# Patient Record
Sex: Male | Born: 1995
Health system: Southern US, Community
[De-identification: ages and names within clinical notes are randomized; demographics above are authoritative.]

## PROBLEM LIST (undated history)

## (undated) DIAGNOSIS — Q9262 Marker chromosomes in abnormal individual: Secondary | ICD-10-CM

## (undated) DIAGNOSIS — K859 Acute pancreatitis without necrosis or infection, unspecified: Secondary | ICD-10-CM

## (undated) DIAGNOSIS — F840 Autistic disorder: Secondary | ICD-10-CM

## (undated) HISTORY — DX: Acute pancreatitis without necrosis or infection, unspecified: K85.90

## (undated) HISTORY — DX: Marker chromosomes in abnormal individual: Q92.62

## (undated) HISTORY — DX: Autistic disorder: F84.0

---
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
## 2014-12-17 ENCOUNTER — Encounter (HOSPITAL_COMMUNITY): Payer: Self-pay

## 2014-12-17 ENCOUNTER — Inpatient Hospital Stay (HOSPITAL_COMMUNITY): Payer: 59

## 2014-12-17 ENCOUNTER — Inpatient Hospital Stay (HOSPITAL_COMMUNITY)
Admission: EM | Admit: 2014-12-17 | Discharge: 2014-12-28 | DRG: 439 | Disposition: A | Discharge status: Home / Self Care | Payer: 59 | Attending: General Surgery | Admitting: General Surgery

## 2014-12-17 ENCOUNTER — Emergency Department (HOSPITAL_COMMUNITY): Payer: 59

## 2014-12-17 DIAGNOSIS — Z8249 Family history of ischemic heart disease and other diseases of the circulatory system: Secondary | ICD-10-CM

## 2014-12-17 DIAGNOSIS — K3580 Unspecified acute appendicitis: Secondary | ICD-10-CM

## 2014-12-17 DIAGNOSIS — R112 Nausea with vomiting, unspecified: Secondary | ICD-10-CM | POA: Diagnosis present

## 2014-12-17 DIAGNOSIS — E669 Obesity, unspecified: Secondary | ICD-10-CM | POA: Diagnosis present

## 2014-12-17 DIAGNOSIS — K859 Acute pancreatitis without necrosis or infection, unspecified: Secondary | ICD-10-CM | POA: Diagnosis present

## 2014-12-17 DIAGNOSIS — F840 Autistic disorder: Secondary | ICD-10-CM | POA: Diagnosis present

## 2014-12-17 DIAGNOSIS — Z6833 Body mass index (BMI) 33.0-33.9, adult: Secondary | ICD-10-CM

## 2014-12-17 DIAGNOSIS — R935 Abnormal findings on diagnostic imaging of other abdominal regions, including retroperitoneum: Secondary | ICD-10-CM | POA: Diagnosis not present

## 2014-12-17 DIAGNOSIS — Z825 Family history of asthma and other chronic lower respiratory diseases: Secondary | ICD-10-CM | POA: Diagnosis not present

## 2014-12-17 DIAGNOSIS — K219 Gastro-esophageal reflux disease without esophagitis: Secondary | ICD-10-CM | POA: Diagnosis not present

## 2014-12-17 DIAGNOSIS — K850 Idiopathic acute pancreatitis: Secondary | ICD-10-CM | POA: Diagnosis not present

## 2014-12-17 LAB — CBC WITH DIFFERENTIAL/PLATELET
Basophils Absolute: 0 10*3/uL (ref 0.0–0.1)
Basophils Relative: 0 % (ref 0–1)
Eosinophils Absolute: 0.2 10*3/uL (ref 0.0–0.7)
Eosinophils Relative: 1 % (ref 0–5)
HEMATOCRIT: 47.1 % (ref 39.0–52.0)
Hemoglobin: 15.2 g/dL (ref 13.0–17.0)
Lymphocytes Relative: 10 % — ABNORMAL LOW (ref 12–46)
Lymphs Abs: 1.6 10*3/uL (ref 0.7–4.0)
MCH: 26.4 pg (ref 26.0–34.0)
MCHC: 32.3 g/dL (ref 30.0–36.0)
MCV: 81.8 fL (ref 78.0–100.0)
Monocytes Absolute: 1.1 10*3/uL — ABNORMAL HIGH (ref 0.1–1.0)
Monocytes Relative: 7 % (ref 3–12)
Neutro Abs: 13.2 10*3/uL — ABNORMAL HIGH (ref 1.7–7.7)
Neutrophils Relative %: 82 % — ABNORMAL HIGH (ref 43–77)
Platelets: 234 10*3/uL (ref 150–400)
RBC: 5.76 MIL/uL (ref 4.22–5.81)
RDW: 12.7 % (ref 11.5–15.5)
WBC: 16.2 10*3/uL — ABNORMAL HIGH (ref 4.0–10.5)

## 2014-12-17 LAB — COMPREHENSIVE METABOLIC PANEL
ALT: 39 U/L (ref 17–63)
AST: 31 U/L (ref 15–41)
Albumin: 4.4 g/dL (ref 3.5–5.0)
Alkaline Phosphatase: 104 U/L (ref 38–126)
Anion gap: 13 (ref 5–15)
BUN: 12 mg/dL (ref 6–20)
CO2: 22 mmol/L (ref 22–32)
Calcium: 9.8 mg/dL (ref 8.9–10.3)
Chloride: 103 mmol/L (ref 101–111)
Creatinine, Ser: 0.93 mg/dL (ref 0.61–1.24)
GFR calc Af Amer: >60 mL/min (ref >60)
GFR calc non Af Amer: >60 mL/min (ref >60)
Glucose, Bld: 156 mg/dL — ABNORMAL HIGH (ref 65–99)
Potassium: 3.5 mmol/L (ref 3.5–5.1)
Sodium: 138 mmol/L (ref 135–145)
Total Bilirubin: 1.3 mg/dL — ABNORMAL HIGH (ref 0.3–1.2)
Total Protein: 7.5 g/dL (ref 6.5–8.1)

## 2014-12-17 LAB — URINALYSIS, ROUTINE W REFLEX MICROSCOPIC
Glucose, UA: NEGATIVE mg/dL
HGB URINE DIPSTICK: NEGATIVE
Ketones, ur: 40 mg/dL — AB
LEUKOCYTES UA: NEGATIVE
NITRITE: NEGATIVE
PH: 5.5 (ref 5.0–8.0)
PROTEIN: NEGATIVE mg/dL
Specific Gravity, Urine: 1.022 (ref 1.005–1.030)
UROBILINOGEN UA: 1 mg/dL (ref 0.0–1.0)

## 2014-12-17 LAB — LIPASE, BLOOD
LIPASE: 383 U/L — AB (ref 22–51)
Lipase: 278 U/L — ABNORMAL HIGH (ref 22–51)

## 2014-12-17 MED ORDER — POTASSIUM CL IN DEXTROSE 5% 20 MEQ/L IV SOLN: 20.0000 meq | INTRAVENOUS | Status: DC

## 2014-12-17 MED ORDER — ONDANSETRON HCL 4 MG/2ML IJ SOLN
4.0000 mg | Freq: Once | INTRAMUSCULAR | Status: AC
  Administered 2014-12-17: 4 mg via INTRAVENOUS
  Filled 2014-12-17: qty 2

## 2014-12-17 MED ORDER — SODIUM CHLORIDE 0.9 % IV BOLUS (SEPSIS): 750.0000 mL | Freq: Once | INTRAVENOUS | Status: DC

## 2014-12-17 MED ORDER — IOHEXOL 300 MG/ML  SOLN
100.0000 mL | Freq: Once | INTRAMUSCULAR | Status: AC | PRN
  Administered 2014-12-17: 100 mL via INTRAVENOUS

## 2014-12-17 MED ORDER — KCL IN DEXTROSE-NACL 20-5-0.9 MEQ/L-%-% IV SOLN
INTRAVENOUS | Status: DC
  Administered 2014-12-17: 17:00:00 via INTRAVENOUS
  Administered 2014-12-18: 150 mL/h via INTRAVENOUS
  Administered 2014-12-18 – 2014-12-19 (×3): via INTRAVENOUS
  Administered 2014-12-19: 1000 mL via INTRAVENOUS
  Administered 2014-12-19 – 2014-12-20 (×5): via INTRAVENOUS
  Administered 2014-12-21: 100 mL/h via INTRAVENOUS
  Administered 2014-12-21 – 2014-12-22 (×2): via INTRAVENOUS
  Administered 2014-12-22 (×2): 100 mL/h via INTRAVENOUS
  Administered 2014-12-23 – 2014-12-24 (×2): via INTRAVENOUS
  Administered 2014-12-25 – 2014-12-26 (×3): 100 mL/h via INTRAVENOUS
  Administered 2014-12-26: 05:00:00 via INTRAVENOUS
  Administered 2014-12-26: 100 mL/h via INTRAVENOUS
  Administered 2014-12-27: 50 mL/h via INTRAVENOUS
  Administered 2014-12-27: 10:00:00 via INTRAVENOUS
  Filled 2014-12-17 (×31): qty 1000

## 2014-12-17 MED ORDER — HYDROMORPHONE HCL 1 MG/ML IJ SOLN
0.5000 mg | INTRAMUSCULAR | Status: DC | PRN
  Administered 2014-12-17 – 2014-12-22 (×9): 1 mg via INTRAVENOUS
  Filled 2014-12-17 (×10): qty 1

## 2014-12-17 MED ORDER — DEXTROSE 5 % IV SOLN
1.0000 g | Freq: Once | INTRAVENOUS | Status: AC
  Administered 2014-12-17: 1 g via INTRAVENOUS
  Filled 2014-12-17: qty 1

## 2014-12-17 MED ORDER — ONDANSETRON 4 MG PO TBDP
4.0000 mg | ORAL_TABLET | Freq: Once | ORAL | Status: AC
  Administered 2014-12-17: 4 mg via ORAL
  Filled 2014-12-17: qty 1

## 2014-12-17 MED ORDER — HYDROMORPHONE HCL 1 MG/ML IJ SOLN
1.0000 mg | Freq: Once | INTRAMUSCULAR | Status: AC
  Administered 2014-12-17: 1 mg via INTRAVENOUS
  Filled 2014-12-17: qty 1

## 2014-12-17 MED ORDER — SODIUM CHLORIDE 0.9 % IV BOLUS (SEPSIS)
1000.0000 mL | Freq: Once | INTRAVENOUS | Status: AC
Start: 2014-12-17 — End: 2014-12-17
  Administered 2014-12-17: 1000 mL via INTRAVENOUS

## 2014-12-17 MED ORDER — HYDROMORPHONE HCL 1 MG/ML IJ SOLN
1.0000 mg | Freq: Once | INTRAMUSCULAR | Status: AC
Start: 2014-12-17 — End: 2014-12-17
  Administered 2014-12-17: 1 mg via INTRAVENOUS
  Filled 2014-12-17: qty 1

## 2014-12-17 MED ORDER — ONDANSETRON HCL 4 MG/2ML IJ SOLN
4.0000 mg | Freq: Four times a day (QID) | INTRAMUSCULAR | Status: DC | PRN
  Administered 2014-12-17 – 2014-12-21 (×3): 4 mg via INTRAVENOUS
  Filled 2014-12-17 (×4): qty 2

## 2014-12-17 MED ORDER — IOHEXOL 300 MG/ML  SOLN
50.0000 mL | Freq: Once | INTRAMUSCULAR | Status: AC | PRN
  Administered 2014-12-17: 50 mL via ORAL

## 2014-12-17 MED ORDER — ENOXAPARIN SODIUM 60 MG/0.6ML ~~LOC~~ SOLN
0.5000 mg/kg | SUBCUTANEOUS | Status: DC
  Administered 2014-12-17 – 2014-12-27 (×11): 50 mg via SUBCUTANEOUS
  Filled 2014-12-17 (×13): qty 0.6

## 2014-12-17 NOTE — ED Notes (Signed)
Urinal at the bedside, patient aware that a urine sample is needed.

## 2014-12-17 NOTE — H&P (Addendum)
Daryl Foster is an 19 y.o. male.   PCP: Dr. Lutricia Feil  Chief Complaint:  Right flank pain, nausea and vomiting  HPI: 19 y/o male with history of autism, here with both parents.  He awoke 4 AM with abdominal pain, and 2 episodes of vomiting.  He had a low grade temp 99.9. He has not eaten anything since last PM,  He lives at home with family and does not drink or smoke.  He is well cared for by his family. Family brought him to the ED this AM about 9 AM.  He is afebrile in the ED, HR up some, but VSS.  WBC up to 16.2, left shift, lipase is also elevated and 383, glucose is 156.  CT scan shows:  Inflammatory changes are noted around the pancreatic body and tail suggesting acute pancreatitis.  The appendix is mildly enlarged at approximately 9 mm with mild surrounding inflammation concerning for appendicitis in the appropriate clinical setting. There is no evidence of bowel obstruction. No abnormal fluid collection is noted. Urinary bladder appears normal. No significant adenopathy is noted.  We are ask to see   Past Medical History  Diagnosis Date  Autism   Body mass index is 33.21 kg/(m^2). 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 No pertinent past surgical history.  Family History  Problem Relation Age of Onset  . Hypertension Mother   . Asthma Father    Social History:  reports that he has never smoked. He has never used smokeless tobacco. He reports that he does not drink alcohol or use illicit drugs.  Allergies: No Known Allergies   Prior to Admission medications   Medication Sig Start Date End Date Taking? Authorizing Provider  Pediatric Multivit-Minerals-C (RA GUMMY VITAMINS & MINERALS PO) Take 1 tablet by mouth daily. Gummy vitamin not a tablet   Yes Historical Provider, MD     Results for orders placed or performed during the hospital encounter of 12/17/14 (from the past 48 hour(s))  CBC with Differential     Status: Abnormal   Collection  Time: 12/17/14  9:37 AM  Result Value Ref Range   WBC 16.2 (H) 4.0 - 10.5 K/uL   RBC 5.76 4.22 - 5.81 MIL/uL   Hemoglobin 15.2 13.0 - 17.0 g/dL   HCT 47.1 39.0 - 52.0 %   MCV 81.8 78.0 - 100.0 fL   MCH 26.4 26.0 - 34.0 pg   MCHC 32.3 30.0 - 36.0 g/dL   RDW 12.7 11.5 - 15.5 %   Platelets 234 150 - 400 K/uL   Neutrophils Relative % 82 (H) 43 - 77 %   Neutro Abs 13.2 (H) 1.7 - 7.7 K/uL   Lymphocytes Relative 10 (L) 12 - 46 %   Lymphs Abs 1.6 0.7 - 4.0 K/uL   Monocytes Relative 7 3 - 12 %   Monocytes Absolute 1.1 (H) 0.1 - 1.0 K/uL   Eosinophils Relative 1 0 - 5 %   Eosinophils Absolute 0.2 0.0 - 0.7 K/uL   Basophils Relative 0 0 - 1 %   Basophils Absolute 0.0 0.0 - 0.1 K/uL  Comprehensive metabolic panel     Status: Abnormal   Collection Time: 12/17/14  9:37 AM  Result Value Ref Range   Sodium 138 135 - 145 mmol/L   Potassium 3.5 3.5 - 5.1 mmol/L   Chloride 103 101 - 111 mmol/L   CO2 22 22 - 32 mmol/L   Glucose, Bld  156 (H) 65 - 99 mg/dL   BUN 12 6 - 20 mg/dL   Creatinine, Ser 7.65 0.61 - 1.24 mg/dL   Calcium 9.8 8.9 - 58.7 mg/dL   Total Protein 7.5 6.5 - 8.1 g/dL   Albumin 4.4 3.5 - 5.0 g/dL   AST 31 15 - 41 U/L   ALT 39 17 - 63 U/L   Alkaline Phosphatase 104 38 - 126 U/L   Total Bilirubin 1.3 (H) 0.3 - 1.2 mg/dL   GFR calc non Af Amer >60 >60 mL/min   GFR calc Af Amer >60 >60 mL/min    Comment: (NOTE) The eGFR has been calculated using the CKD EPI equation. This calculation has not been validated in all clinical situations. eGFR's persistently <60 mL/min signify possible Chronic Kidney Disease.    Anion gap 13 5 - 15  Lipase, blood     Status: Abnormal   Collection Time: 12/17/14  9:37 AM  Result Value Ref Range   Lipase 383 (H) 22 - 51 U/L  Urinalysis, Routine w reflex microscopic (not at [facility])     Status: Abnormal   Collection Time: 12/17/14 10:37 AM  Result Value Ref Range   Color, Urine AMBER (A) YELLOW    Comment: BIOCHEMICALS MAY BE AFFECTED BY  COLOR YELLOW    APPearance CLEAR CLEAR   Specific Gravity, Urine 1.022 1.005 - 1.030   pH 5.5 5.0 - 8.0   Glucose, UA NEGATIVE NEGATIVE mg/dL   Hgb urine dipstick NEGATIVE NEGATIVE   Bilirubin Urine SMALL (A) NEGATIVE   Ketones, ur 40 (A) NEGATIVE mg/dL   Protein, ur NEGATIVE NEGATIVE mg/dL   Urobilinogen, UA 1.0 0.0 - 1.0 mg/dL   Nitrite NEGATIVE NEGATIVE   Leukocytes, UA NEGATIVE NEGATIVE    Comment: MICROSCOPIC NOT DONE ON URINES WITH NEGATIVE PROTEIN, BLOOD, LEUKOCYTES, NITRITE, OR GLUCOSE <1000 mg/dL.   Ct Abdomen Pelvis W Contrast  12/17/2014   CLINICAL DATA:  Acute right flank pain.  EXAM: CT ABDOMEN AND PELVIS WITH CONTRAST  TECHNIQUE: Multidetector CT imaging of the abdomen and pelvis was performed using the standard protocol following bolus administration of intravenous contrast.  CONTRAST:  OMNIPAQUE IOHEXOL 300 MG/ML  SOLN  COMPARISON:  None.  FINDINGS: Visualized lung bases appear normal. No significant osseous abnormality is noted.  No gallstones are noted. The liver and spleen appear normal. Inflammatory changes are noted around the pancreatic body and tail suggesting acute pancreatitis. Adrenal glands and kidneys appear normal. No hydronephrosis or renal obstruction is noted. No renal or ureteral calculi are noted. The appendix is mildly enlarged at approximately 9 mm with mild surrounding inflammation concerning for appendicitis in the appropriate clinical setting. There is no evidence of bowel obstruction. No abnormal fluid collection is noted. Urinary bladder appears normal. No significant adenopathy is noted.  IMPRESSION: Inflammatory changes are noted around the pancreatic body and tail concerning for acute pancreatitis. No pseudocyst formation is noted. Correlation with laboratory data is recommended.  The appendix appears to be mildly enlarged with surrounding inflammatory changes suggesting acute appendicitis in the appropriate clinical setting.   Electronically Signed    By: Lupita Raider, M.D.   On: 12/17/2014 13:34    Review of Systems  Constitutional: Negative.   HENT: Negative.   Eyes: Negative.   Respiratory: Negative.   Cardiovascular: Negative.   Gastrointestinal: Negative.   Genitourinary: Negative.   Musculoskeletal: Negative.   Skin: Negative.   Neurological: Negative.   Endo/Heme/Allergies: Negative.   Psychiatric/Behavioral: Negative.  Blood pressure 126/60, pulse 109, temperature 98.9 F (37.2 C), temperature source Oral, resp. rate 16, height $RemoveBe'5\' 9"'SoowHnlAB$  (1.753 m), weight 102.059 kg (225 lb), SpO2 100 %. Physical Exam  Constitutional: He appears well-developed and well-nourished. No distress.  Feels better after dilaudid   HENT:  Head: Normocephalic and atraumatic.  Nose: Nose normal.  Eyes: Conjunctivae and EOM are normal. Right eye exhibits no discharge. Left eye exhibits no discharge. No scleral icterus.  Neck: Normal range of motion. Neck supple. No JVD present. No tracheal deviation present. No thyromegaly present.  Cardiovascular: Regular rhythm, normal heart sounds and intact distal pulses.   No murmur heard. tahcycardic  Respiratory: Effort normal and breath sounds normal. No respiratory distress. He has no wheezes. He has no rales. He exhibits no tenderness.  GI: Soft. Bowel sounds are normal. He exhibits no distension and no mass. There is tenderness (He is most tender LUQ and midepigastric area.  No tenderness RLQ.  ). There is no rebound and no guarding.  Musculoskeletal: He exhibits no edema.  Lymphadenopathy:    He has no cervical adenopathy.  Neurological: He is alert. No cranial nerve deficit.  Skin: Skin is warm and dry. No rash noted. He is not diaphoretic. No erythema. No pallor.  Psychiatric: He has a normal mood and affect. His behavior is normal. Judgment and thought content normal.  He is autistic, family is with him and I think he understands some of what is occuring.     Assessment/Plan Acute  pancreatitis Leukocytosis Possible abnormal appendix Autism BMI 33  Plan:  Admit, NPO, Hydrate, check abdominal ultrasound looking for gallstones/sludge, check lipid panel,follow labs and progression. It would be very unusual to have both pancreatitis and appendicitis concurrently.  We currently believe the pancreatitis is the major issue here.  JENNINGS,WILLARD 12/17/2014, 2:20 PM   I have interviewed and examined the patient and discussed with family members present, reviewed imaging personally and lab work. He clearly appears to have acute pancreatitis which I suspect is somewhat mild based on his lipase and CT scan. His pain is in the upper abdomen and he is mildly to moderately tender in the upper abdomen. No significant right lower quadrant tenderness. I do not believe he has appendicitis based on his clinical presentation. Source for pancreatitis is not clear. He does not drink alcohol,prescription medications and gallbladder ultrasound is negative. Triglycerides will be drawn in the morning. Continue IV fluids and bowel rest.  Edward Jolly MD, FACS  12/17/2014, 5:24 PM

## 2014-12-17 NOTE — Progress Notes (Signed)
PHARMACY CONSULT: Lovenox for VTE prophylaxis   Wt: 102 kg BMI:  33 Scr:  0.93 CrCl >30 ml/hr  H/H: 47.1/15.2 Pltc: 234  A/P:  Patient admitted with likely pancreatitis. Current plan by surgery is bowel rest. Due to morbid obesity, begin weight-adjusted lovenox 0.5mg /kg SQ q24h  Monitor CBC and renal function, adjust as needed   Clance Boll, PharmD, BCPS Pager: [phone] 12/17/2014 3:32 PM

## 2014-12-17 NOTE — ED Notes (Signed)
Surgery PA at bedside.  

## 2014-12-17 NOTE — ED Notes (Signed)
5TH FLOOR CALLED AT 15:40.Marland KitchenMarland KitchenKLJ

## 2014-12-17 NOTE — ED Notes (Signed)
Patient is autistic. Patient c/o c/o right flank pain and vomiting x 2 today since 0430.

## 2014-12-17 NOTE — ED Provider Notes (Signed)
CSN: [id number]     Arrival date & time 12/17/14  4098 History   First MD Initiated Contact with Patient 12/17/14 [phone]     Chief Complaint  Patient presents with  . Flank Pain     (Consider location/radiation/quality/duration/timing/severity/associated sxs/prior Treatment) Patient is a 19 y.o. male presenting with flank pain. The history is provided by the patient and a parent. No language interpreter was used.  Flank Pain Associated symptoms include abdominal pain, nausea and vomiting. Pertinent negatives include no chest pain or fever.  Daryl Foster is a 19 year old male with a history of autism who presents for abdominal pain that awoke him from sleep at 4 AM today as well as 2 episodes of vomiting without blood. Mom states she gave him tums. She took his temperature this morning which was 99.9. She denies anything out of the ordinary such as him not eating appropriately or change in diet. Nothing makes his symptoms better or worse. Daryl Foster denies any chills, chest pain, shortness of breath, cough, diarrhea, constipation, dysuria, hematuria, urinary frequency, hematochezia.  Mom denies any prior abdominal surgeries. Last BM was last night. Father has history of appendectomy.    Past Medical History  Diagnosis Date  . Autism   . 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 No pertinent past surgical history. Family History  Problem Relation Age of Onset  . Hypertension Mother   . Asthma Father    History  Substance Use Topics  . Smoking status: Never Smoker   . Smokeless tobacco: Never Used  . Alcohol Use: No    Review of Systems  Constitutional: Negative for fever.  Cardiovascular: Negative for chest pain.  Gastrointestinal: Positive for nausea, vomiting and abdominal pain.  Genitourinary: Negative for flank pain.  All other systems reviewed and are negative.     Allergies  Review of patient's allergies indicates no known allergies.  Home Medications    Prior to Admission medications   Medication Sig Start Date End Date Taking? Authorizing Provider  Pediatric Multivit-Minerals-C (RA GUMMY VITAMINS & MINERALS PO) Take 1 tablet by mouth daily. Gummy vitamin not a tablet   Yes Historical Provider, MD   BP 126/60 mmHg  Pulse 109  Temp(Src) 99.4 F (37.4 C) (Oral)  Resp 16  Ht  (1.753 m)  Wt 225 lb (102.059 kg)  BMI 33.21 kg/m2  SpO2 100% Physical Exam  Constitutional: Daryl Foster is oriented to person, place, and time. Daryl Foster appears well-developed and well-nourished.  Verbal and cooperative.   HENT:  Head: Normocephalic and atraumatic.  Eyes: Conjunctivae are normal.  Neck: Normal range of motion. Neck supple.  Cardiovascular: Normal rate, regular rhythm and normal heart sounds.   Pulmonary/Chest: Effort normal and breath sounds normal.  Abdominal: Soft. Daryl Foster exhibits no distension and no mass. There is generalized tenderness. There is no rigidity, no rebound, no guarding and no CVA tenderness.  Obese abdomen. Daryl Foster has generalized abdominal tenderness without rebound or guarding. No CVA tenderness. Actively vomiting at bedside.  Musculoskeletal: Normal range of motion.  Neurological: Daryl Foster is alert and oriented to person, place, and time.  Skin: Skin is warm and dry.  Psychiatric: Daryl Foster has a normal mood and affect. His behavior is normal.  Nursing note and vitals reviewed.   ED Course  Procedures (including critical care time) Labs Review Labs Reviewed  CBC WITH DIFFERENTIAL/PLATELET - Abnormal; Notable for the following:    WBC 16.2 (*)    Neutrophils Relative % 82 (*)  Neutro Abs 13.2 (*)    Lymphocytes Relative 10 (*)    Monocytes Absolute 1.1 (*)    All other components within normal limits  COMPREHENSIVE METABOLIC PANEL - Abnormal; Notable for the following:    Glucose, Bld 156 (*)    Total Bilirubin 1.3 (*)    All other components within normal limits  LIPASE, BLOOD - Abnormal; Notable for the following:    Lipase 383 (*)     All other components within normal limits  URINALYSIS, ROUTINE W REFLEX MICROSCOPIC (NOT AT [facility]) - Abnormal; Notable for the following:    Color, Urine AMBER (*)    Bilirubin Urine SMALL (*)    Ketones, ur 40 (*)    All other components within normal limits  LIPASE, BLOOD    Imaging Review Ct Abdomen Pelvis W Contrast  12/17/2014   CLINICAL DATA:  Acute right flank pain.  EXAM: CT ABDOMEN AND PELVIS WITH CONTRAST  TECHNIQUE: Multidetector CT imaging of the abdomen and pelvis was performed using the standard protocol following bolus administration of intravenous contrast.  CONTRAST:  OMNIPAQUE IOHEXOL 300 MG/ML  SOLN  COMPARISON:  None.  FINDINGS: Visualized lung bases appear normal. No significant osseous abnormality is noted.  No gallstones are noted. The liver and spleen appear normal. Inflammatory changes are noted around the pancreatic body and tail suggesting acute pancreatitis. Adrenal glands and kidneys appear normal. No hydronephrosis or renal obstruction is noted. No renal or ureteral calculi are noted. The appendix is mildly enlarged at approximately 9 mm with mild surrounding inflammation concerning for appendicitis in the appropriate clinical setting. There is no evidence of bowel obstruction. No abnormal fluid collection is noted. Urinary bladder appears normal. No significant adenopathy is noted.  IMPRESSION: Inflammatory changes are noted around the pancreatic body and tail concerning for acute pancreatitis. No pseudocyst formation is noted. Correlation with laboratory data is recommended.  The appendix appears to be mildly enlarged with surrounding inflammatory changes suggesting acute appendicitis in the appropriate clinical setting.   Electronically Signed   By: Lupita Raider, M.D.   On: 12/17/2014 13:34     EKG Interpretation None      MDM   Final diagnoses:  Acute appendicitis, unspecified acute appendicitis type  Acute pancreatitis, unspecified pancreatitis type    Patient presents for abdominal pain that awoke him at 7am with nausea and vomiting. Daryl Foster is stable but tachycardic. Daryl Foster is well appearing and in no acute distress after pain and nausea medications. Medications  cefOXitin (MEFOXIN) 1 g in dextrose 5 % 50 mL IVPB (1 g Intravenous New Bag/Given 12/17/14 1508)  sodium chloride 0.9 % bolus 750 mL (not administered)  HYDROmorphone (DILAUDID) injection 0.5-1 mg (not administered)  ondansetron (ZOFRAN) injection 4 mg (not administered)  dextrose 5 % and 0.9 % NaCl with KCl 20 mEq/L infusion (not administered)  ondansetron (ZOFRAN-ODT) disintegrating tablet 4 mg (4 mg Oral Given 12/17/14 0926)  sodium chloride 0.9 % bolus 1,000 mL (0 mLs Intravenous Stopped 12/17/14 1121)  HYDROmorphone (DILAUDID) injection 1 mg (1 mg Intravenous Given 12/17/14 1028)  ondansetron (ZOFRAN) injection 4 mg (4 mg Intravenous Given 12/17/14 1038)  iohexol (OMNIPAQUE) 300 MG/ML solution 50 mL (50 mLs Oral Contrast Given 12/17/14 1115)  iohexol (OMNIPAQUE) 300 MG/ML solution 100 mL (100 mLs Intravenous Contrast Given 12/17/14 1304)  ondansetron (ZOFRAN) injection 4 mg (4 mg Intravenous Given 12/17/14 1429)  sodium chloride 0.9 % bolus 1,000 mL (1,000 mLs Intravenous New Bag/Given 12/17/14 1429)  HYDROmorphone (DILAUDID)  injection 1 mg (1 mg Intravenous Given 12/17/14 1446)  Daryl Foster has an elevated WBC of 16.2, lipase of 383, and bili of 1.3.  CT abdomen shows acute pancreatitis without pseudocyst formation and appendicitis. Patient was kept NPO and I have ordered cefoxitin. I informed Dr. Estell Harpin regarding the findings. I spoke to Will the PA for general surgery who will admit the patient.     Catha Gosselin, PA-C 12/17/14 1533  Bethann Berkshire, MD 12/17/14 [phone]

## 2014-12-18 LAB — CBC
HCT: 43.2 % (ref 39.0–52.0)
HEMOGLOBIN: 13.5 g/dL (ref 13.0–17.0)
MCH: 25.7 pg — AB (ref 26.0–34.0)
MCHC: 31.3 g/dL (ref 30.0–36.0)
MCV: 82.1 fL (ref 78.0–100.0)
PLATELETS: 184 10*3/uL (ref 150–400)
RBC: 5.26 MIL/uL (ref 4.22–5.81)
RDW: 12.6 % (ref 11.5–15.5)
WBC: 12.1 10*3/uL — ABNORMAL HIGH (ref 4.0–10.5)

## 2014-12-18 LAB — COMPREHENSIVE METABOLIC PANEL
ALT: 27 U/L (ref 17–63)
ANION GAP: 6 (ref 5–15)
AST: 17 U/L (ref 15–41)
Albumin: 3.7 g/dL (ref 3.5–5.0)
Alkaline Phosphatase: 83 U/L (ref 38–126)
BUN: 7 mg/dL (ref 6–20)
CALCIUM: 8.8 mg/dL — AB (ref 8.9–10.3)
CO2: 27 mmol/L (ref 22–32)
CREATININE: 0.87 mg/dL (ref 0.61–1.24)
Chloride: 106 mmol/L (ref 101–111)
GFR calc non Af Amer: >60 mL/min (ref >60)
GLUCOSE: 128 mg/dL — AB (ref 65–99)
Potassium: 3.9 mmol/L (ref 3.5–5.1)
SODIUM: 139 mmol/L (ref 135–145)
Total Bilirubin: 1.2 mg/dL (ref 0.3–1.2)
Total Protein: 6.7 g/dL (ref 6.5–8.1)

## 2014-12-18 LAB — LIPID PANEL
Cholesterol: 105 mg/dL (ref 0–200)
HDL: 20 mg/dL — AB (ref >40)
LDL CALC: 67 mg/dL (ref 0–99)
TRIGLYCERIDES: 89 mg/dL (ref <150)
Total CHOL/HDL Ratio: 5.3 RATIO
VLDL: 18 mg/dL (ref 0–40)

## 2014-12-18 LAB — LIPASE, BLOOD: Lipase: 121 U/L — ABNORMAL HIGH (ref 22–51)

## 2014-12-18 MED ORDER — ACETAMINOPHEN 325 MG PO TABS
650.0000 mg | ORAL_TABLET | Freq: Four times a day (QID) | ORAL | Status: DC | PRN
  Administered 2014-12-18 – 2014-12-26 (×12): 650 mg via ORAL
  Filled 2014-12-18 (×12): qty 2

## 2014-12-18 NOTE — Progress Notes (Signed)
Subjective: Less pain today.  Objective: Vital signs in last 24 hours: Temp:  [98.5 F (36.9 C)-101.1 F (38.4 C)] 101.1 F (38.4 C) (07/23 0535) Pulse Rate:  [107-118] 118 (07/23 0535) Resp:  [16-18] 18 (07/23 0535) BP: (119-142)/(60-81) 119/74 mmHg (07/23 0535) SpO2:  [94 %-100 %] 94 % (07/23 0535) Last BM Date: 12/17/14  Intake/Output from previous day: 07/22 0701 - 07/23 0700 In: 1635.4 [I.V.:1635.4] Out: -  Intake/Output this shift:    PE: General- In NAD Abdomen-mild LLQ tenderness, moderate epigastric tenderness and guarding, no RLQ tenderness.  Lab Results:   Recent Labs  12/17/14 0937 12/18/14 0430  WBC 16.2* 12.1*  HGB 15.2 13.5  HCT 47.1 43.2  PLT 234 184   BMET  Recent Labs  12/17/14 0937 12/18/14 0430  NA 138 139  K 3.5 3.9  CL 103 106  CO2 22 27  GLUCOSE 156* 128*  BUN 12 7  CREATININE 0.93 0.87  CALCIUM 9.8 8.8*   PT/INR No results for input(s): LABPROT, INR in the last 72 hours. Comprehensive Metabolic Panel:    Component Value Date/Time   NA 139 12/18/2014 0430   NA 138 12/17/2014 0937   K 3.9 12/18/2014 0430   K 3.5 12/17/2014 0937   CL 106 12/18/2014 0430   CL 103 12/17/2014 0937   CO2 27 12/18/2014 0430   CO2 22 12/17/2014 0937   BUN 7 12/18/2014 0430   BUN 12 12/17/2014 0937   CREATININE 0.87 12/18/2014 0430   CREATININE 0.93 12/17/2014 0937   GLUCOSE 128* 12/18/2014 0430   GLUCOSE 156* 12/17/2014 0937   CALCIUM 8.8* 12/18/2014 0430   CALCIUM 9.8 12/17/2014 0937   AST 17 12/18/2014 0430   AST 31 12/17/2014 0937   ALT 27 12/18/2014 0430   ALT 39 12/17/2014 0937   ALKPHOS 83 12/18/2014 0430   ALKPHOS 104 12/17/2014 0937   BILITOT 1.2 12/18/2014 0430   BILITOT 1.3* 12/17/2014 0937   PROT 6.7 12/18/2014 0430   PROT 7.5 12/17/2014 0937   ALBUMIN 3.7 12/18/2014 0430   ALBUMIN 4.4 12/17/2014 0937     Studies/Results: US Abdomen Complete  12/17/2014   CLINICAL DATA:  Pancreatitis  EXAM: ULTRASOUND ABDOMEN  COMPLETE  COMPARISON:  12/16/2004 CT abdomen/ pelvis  FINDINGS: Gallbladder: No gallstones or wall thickening visualized. No sonographic Murphy sign noted.  Common bile duct: Diameter: 3 mm  Liver: Hepatic increased echogenicity likely indicates underlying steatosis without focal abnormality or intrahepatic ductal dilatation.  IVC: No abnormality visualized.  Pancreas: Obscured by bowel gas  Spleen: Size and appearance within normal limits.  Right Kidney: Length: 11.4 cm. Echogenicity within normal limits. No mass or hydronephrosis visualized.  Left Kidney: Length: 11 cm. Echogenicity within normal limits. No mass or hydronephrosis visualized.  Abdominal aorta: No proximal aortic aneurysm. Mid and distal aorta obscured by bowel gas.  Other findings: None.  IMPRESSION: Hepatic increased echogenicity likely indicates underlying steatosis without focal abnormality or intrahepatic ductal dilatation. No acute intra-abdominal or pelvic pathology.   Electronically Signed   By: Christiana Pellant M.D.   On: 12/17/2014 16:16   Ct Abdomen Pelvis W Contrast  12/17/2014   CLINICAL DATA:  Acute right flank pain.  EXAM: CT ABDOMEN AND PELVIS WITH CONTRAST  TECHNIQUE: Multidetector CT imaging of the abdomen and pelvis was performed using the standard protocol following bolus administration of intravenous contrast.  CONTRAST:  OMNIPAQUE IOHEXOL 300 MG/ML  SOLN  COMPARISON:  None.  FINDINGS: Visualized lung bases appear normal. No  significant osseous abnormality is noted.  No gallstones are noted. The liver and spleen appear normal. Inflammatory changes are noted around the pancreatic body and tail suggesting acute pancreatitis. Adrenal glands and kidneys appear normal. No hydronephrosis or renal obstruction is noted. No renal or ureteral calculi are noted. The appendix is mildly enlarged at approximately 9 mm with mild surrounding inflammation concerning for appendicitis in the appropriate clinical setting. There is no  evidence of bowel obstruction. No abnormal fluid collection is noted. Urinary bladder appears normal. No significant adenopathy is noted.  IMPRESSION: Inflammatory changes are noted around the pancreatic body and tail concerning for acute pancreatitis. No pseudocyst formation is noted. Correlation with laboratory data is recommended.  The appendix appears to be mildly enlarged with surrounding inflammatory changes suggesting acute appendicitis in the appropriate clinical setting.   Electronically Signed   By: Lupita Raider, M.D.   On: 12/17/2014 13:34    Anti-infectives: Anti-infectives    Start     Dose/Rate Route Frequency Ordered Stop   12/17/14 1415  cefOXitin (MEFOXIN) 1 g in dextrose 5 % 50 mL IVPB     1 g 100 mL/hr over 30 Minutes Intravenous  Once 12/17/14 1405 12/17/14 1538      Assessment Active Problems:   Acute pancreatitis-etiology is unclear; no gallstones, no significantly elevated lipids:  Clinically improving; no clinical evidence of acute appendicitis;     LOS: 1 day   Plan: Continue bowel rest and aggressive hydration.  Repeat lab tomorrow.   Daryl Foster Shela Commons 12/18/2014

## 2014-12-19 LAB — CBC
HEMATOCRIT: 39.9 % (ref 39.0–52.0)
HEMOGLOBIN: 12.4 g/dL — AB (ref 13.0–17.0)
MCH: 25.8 pg — ABNORMAL LOW (ref 26.0–34.0)
MCHC: 31.1 g/dL (ref 30.0–36.0)
MCV: 83.1 fL (ref 78.0–100.0)
Platelets: 146 10*3/uL — ABNORMAL LOW (ref 150–400)
RBC: 4.8 MIL/uL (ref 4.22–5.81)
RDW: 12.6 % (ref 11.5–15.5)
WBC: 12.6 10*3/uL — AB (ref 4.0–10.5)

## 2014-12-19 LAB — COMPREHENSIVE METABOLIC PANEL
ALK PHOS: 69 U/L (ref 38–126)
ALT: 21 U/L (ref 17–63)
AST: 14 U/L — ABNORMAL LOW (ref 15–41)
Albumin: 3.5 g/dL (ref 3.5–5.0)
Anion gap: 5 (ref 5–15)
BUN: 6 mg/dL (ref 6–20)
CHLORIDE: 107 mmol/L (ref 101–111)
CO2: 24 mmol/L (ref 22–32)
Calcium: 8.6 mg/dL — ABNORMAL LOW (ref 8.9–10.3)
Creatinine, Ser: 0.85 mg/dL (ref 0.61–1.24)
GFR calc non Af Amer: >60 mL/min (ref >60)
GLUCOSE: 122 mg/dL — AB (ref 65–99)
Potassium: 3.9 mmol/L (ref 3.5–5.1)
SODIUM: 136 mmol/L (ref 135–145)
TOTAL PROTEIN: 6.5 g/dL (ref 6.5–8.1)
Total Bilirubin: 1.5 mg/dL — ABNORMAL HIGH (ref 0.3–1.2)

## 2014-12-19 LAB — LIPASE, BLOOD: Lipase: 37 U/L (ref 22–51)

## 2014-12-19 NOTE — Progress Notes (Signed)
Subjective: History per mom. Still spiking intermittent temp up to 101 - verified with nursing staff (no fever recorded in EMR but nurse does confirm temp). Pt states he feels better; no n/v.  Objective: Vital signs in last 24 hours: Temp:  [98.5 F (36.9 C)-100.1 F (37.8 C)] 100.1 F (37.8 C) (07/24 0745) Pulse Rate:  [102-113] 105 (07/24 0600) Resp:  [18] 18 (07/24 0600) BP: (115-144)/(63-79) 134/78 mmHg (07/24 0600) SpO2:  [94 %-96 %] 96 % (07/24 0600) Last BM Date: 12/17/14  Intake/Output from previous day: 07/23 0701 - 07/24 0700 In: 3160.8 [I.V.:3160.8] Out: -  Intake/Output this shift:    Alert, nad, laying on beside couch, not ill appearing cta  Soft, obese no RLQ/periumbilical TTP; some grimacing with epigastric palpation  Lab Results:   Recent Labs  12/18/14 0430 12/19/14 0610  WBC 12.1* 12.6*  HGB 13.5 12.4*  HCT 43.2 39.9  PLT 184 146*   BMET  Recent Labs  12/18/14 0430 12/19/14 0610  NA 139 136  K 3.9 3.9  CL 106 107  CO2 27 24  GLUCOSE 128* 122*  BUN 7 6  CREATININE 0.87 0.85  CALCIUM 8.8* 8.6*   PT/INR No results for input(s): LABPROT, INR in the last 72 hours. ABG No results for input(s): PHART, HCO3 in the last 72 hours.  Invalid input(s): PCO2, PO2  Studies/Results: US Abdomen Complete  12/17/2014   CLINICAL DATA:  Pancreatitis  EXAM: ULTRASOUND ABDOMEN COMPLETE  COMPARISON:  12/16/2004 CT abdomen/ pelvis  FINDINGS: Gallbladder: No gallstones or wall thickening visualized. No sonographic Murphy sign noted.  Common bile duct: Diameter: 3 mm  Liver: Hepatic increased echogenicity likely indicates underlying steatosis without focal abnormality or intrahepatic ductal dilatation.  IVC: No abnormality visualized.  Pancreas: Obscured by bowel gas  Spleen: Size and appearance within normal limits.  Right Kidney: Length: 11.4 cm. Echogenicity within normal limits. No mass or hydronephrosis visualized.  Left Kidney: Length: 11 cm. Echogenicity  within normal limits. No mass or hydronephrosis visualized.  Abdominal aorta: No proximal aortic aneurysm. Mid and distal aorta obscured by bowel gas.  Other findings: None.  IMPRESSION: Hepatic increased echogenicity likely indicates underlying steatosis without focal abnormality or intrahepatic ductal dilatation. No acute intra-abdominal or pelvic pathology.   Electronically Signed   By: Christiana Pellant M.D.   On: 12/17/2014 16:16   Ct Abdomen Pelvis W Contrast  12/17/2014   CLINICAL DATA:  Acute right flank pain.  EXAM: CT ABDOMEN AND PELVIS WITH CONTRAST  TECHNIQUE: Multidetector CT imaging of the abdomen and pelvis was performed using the standard protocol following bolus administration of intravenous contrast.  CONTRAST:  OMNIPAQUE IOHEXOL 300 MG/ML  SOLN  COMPARISON:  None.  FINDINGS: Visualized lung bases appear normal. No significant osseous abnormality is noted.  No gallstones are noted. The liver and spleen appear normal. Inflammatory changes are noted around the pancreatic body and tail suggesting acute pancreatitis. Adrenal glands and kidneys appear normal. No hydronephrosis or renal obstruction is noted. No renal or ureteral calculi are noted. The appendix is mildly enlarged at approximately 9 mm with mild surrounding inflammation concerning for appendicitis in the appropriate clinical setting. There is no evidence of bowel obstruction. No abnormal fluid collection is noted. Urinary bladder appears normal. No significant adenopathy is noted.  IMPRESSION: Inflammatory changes are noted around the pancreatic body and tail concerning for acute pancreatitis. No pseudocyst formation is noted. Correlation with laboratory data is recommended.  The appendix appears to be mildly enlarged with  surrounding inflammatory changes suggesting acute appendicitis in the appropriate clinical setting.   Electronically Signed   By: Lupita Raider, M.D.   On: 12/17/2014 13:34     Anti-infectives: Anti-infectives    Start     Dose/Rate Route Frequency Ordered Stop   12/17/14 1415  cefOXitin (MEFOXIN) 1 g in dextrose 5 % 50 mL IVPB     1 g 100 mL/hr over 30 Minutes Intravenous  Once 12/17/14 1405 12/17/14 1538      Assessment/Plan: Acute pancreatitis  Lipase normal, wbc mildly elevated, still spiking some intermittent temp Clinically looks nontoxic Had prolong discussion with father on phone about pancreatitis - length/severity varies, hard to predict, etc.  Believe pancreatitis is still smoldering given temp although lipase normal. Etiology of pancreatitis unclear. Do not believe pt has appendicitis Will do a trial of clears today. Told mom and dad that if pt c/o of stomach pain, n/v, temp spikes we would have to go back to NPO and po trial may fail.  Would still hold off on abx at this point If pancreatitis cont to smolder may need to reimage in a few days  Father very concerned about when his diet is advanced to solids about types of food - pt has a VERY limited diet at home. Will only eat certain things and are things that are not healthy. Advised him we will cross that bridge when we get there.   Explained that Dr Donell Beers would be surgeon of the week   Mary Sella. Andrey Campanile, MD, FACS General, Bariatric, & Minimally Invasive Surgery [facility], [place]     LOS: 2 days    Atilano Ina 12/19/2014

## 2014-12-20 LAB — COMPREHENSIVE METABOLIC PANEL
ALK PHOS: 66 U/L (ref 38–126)
ALT: 21 U/L (ref 17–63)
ANION GAP: 7 (ref 5–15)
AST: 17 U/L (ref 15–41)
Albumin: 3.4 g/dL — ABNORMAL LOW (ref 3.5–5.0)
BUN: 5 mg/dL — ABNORMAL LOW (ref 6–20)
CALCIUM: 8.9 mg/dL (ref 8.9–10.3)
CO2: 25 mmol/L (ref 22–32)
CREATININE: 0.83 mg/dL (ref 0.61–1.24)
Chloride: 105 mmol/L (ref 101–111)
GFR calc Af Amer: >60 mL/min (ref >60)
Glucose, Bld: 127 mg/dL — ABNORMAL HIGH (ref 65–99)
Potassium: 3.7 mmol/L (ref 3.5–5.1)
SODIUM: 137 mmol/L (ref 135–145)
Total Bilirubin: 1.5 mg/dL — ABNORMAL HIGH (ref 0.3–1.2)
Total Protein: 7 g/dL (ref 6.5–8.1)

## 2014-12-20 LAB — CBC WITH DIFFERENTIAL/PLATELET
BASOS ABS: 0 10*3/uL (ref 0.0–0.1)
Basophils Relative: 0 % (ref 0–1)
EOS PCT: 2 % (ref 0–5)
Eosinophils Absolute: 0.2 10*3/uL (ref 0.0–0.7)
HCT: 40.6 % (ref 39.0–52.0)
Hemoglobin: 13 g/dL (ref 13.0–17.0)
Lymphocytes Relative: 15 % (ref 12–46)
Lymphs Abs: 1.7 10*3/uL (ref 0.7–4.0)
MCH: 26.3 pg (ref 26.0–34.0)
MCHC: 32 g/dL (ref 30.0–36.0)
MCV: 82 fL (ref 78.0–100.0)
MONO ABS: 1.4 10*3/uL — AB (ref 0.1–1.0)
Monocytes Relative: 12 % (ref 3–12)
NEUTROS ABS: 8.4 10*3/uL — AB (ref 1.7–7.7)
Neutrophils Relative %: 71 % (ref 43–77)
PLATELETS: 185 10*3/uL (ref 150–400)
RBC: 4.95 MIL/uL (ref 4.22–5.81)
RDW: 12.3 % (ref 11.5–15.5)
WBC: 11.7 10*3/uL — ABNORMAL HIGH (ref 4.0–10.5)

## 2014-12-20 LAB — LIPASE, BLOOD: LIPASE: 38 U/L (ref 22–51)

## 2014-12-20 NOTE — Progress Notes (Signed)
  Subjective: He seems better.  I had him examine himself and he was pain free in all 4 quadrants.  Mid upper epigastric site he was tender by his report.  His father thinks it's his Autism.  He tolerated clears and they would like to try solids.  I told them we were considering CT again and father was not very happy with that idea.  No nausea or pain yesterday even with clears.    Objective: Vital signs in last 24 hours: Temp:  [98.4 F (36.9 C)-100.5 F (38.1 C)] 99.7 F (37.6 C) (07/25 0549) Pulse Rate:  [106-125] 106 (07/25 0549) Resp:  [18] 18 (07/25 0549) BP: (113-137)/(74-85) 137/85 mmHg (07/25 0549) SpO2:  [97 %-100 %] 100 % (07/25 0549) Last BM Date: 12/16/14 477 PO  Diet: clears Voided x 9, no volume Temp 100.1 0700 yesterday and 100.5 at 1500 yesterday Labs this AM T bilirubin 1.5, other LFT'S NORMAL, WBC 11.7 [place] on 7/22 shows no gallstone or GB wall thickening, hepatic steatosis CT on 7/22 showed:  Inflammatory changes are noted around the pancreatic body and tail concerning for acute pancreatitis. No pseudocyst formation is noted. The appendix appears to be mildly enlarged with surrounding inflammatory changes suggesting acute appendicitis in the appropriate clinical setting. Temp 99.3 now Intake/Output from previous day: 07/24 0701 - 07/25 0700 In: 4077 [P.O.:477; I.V.:3600] Out: -  Intake/Output this shift:    General appearance: alert, cooperative and no distress Resp: clear to auscultation bilaterally and he enjoys the IS GI: soft, he reports some discomfort with the mid epigastric area on palpation.  Otherwise normal exam  Lab Results:   Recent Labs  12/19/14 0610 12/20/14 0556  WBC 12.6* 11.7*  HGB 12.4* 13.0  HCT 39.9 40.6  PLT 146* 185    BMET  Recent Labs  12/19/14 0610 12/20/14 0556  NA 136 137  K 3.9 3.7  CL 107 105  CO2 24 25  GLUCOSE 122* 127*  BUN 6 5*  CREATININE 0.85 0.83  CALCIUM 8.6* 8.9   PT/INR No results for input(s):  LABPROT, INR in the last 72 hours.   Recent Labs Lab 12/17/14 0937 12/18/14 0430 12/19/14 0610 12/20/14 0556  AST 31 17 14* 17  ALT 39 ALKPHOS 104 83 69 66  BILITOT 1.3* 1.2 1.5* 1.5*  PROT 7.5 6.7 6.5 7.0  ALBUMIN 4.4 3.7 3.5 3.4*     Lipase     Component Value Date/Time   LIPASE 38 12/20/2014 0556     Studies/Results: No results found.  Medications: . enoxaparin (LOVENOX) injection  0.5 mg/kg Subcutaneous Q24H  . sodium chloride  750 mL Intravenous Once   . dextrose 5 % and 0.9 % NaCl with KCl 20 mEq/L 150 mL/hr at 12/20/14 0754    Assessment/Plan Acute pancreatitis Leukocytosis Possible abnormal appendix Autism BMI 33 Antibiotics:  None DVT:  Lovenox/SCD  Plan:  I will leave him on clears for now, decrease his IV rate and discuss with Dr. Donell Beers. Vomited about 300 ml after initial visit.        LOS: 3 days    Joselin Crandell 12/20/2014

## 2014-12-20 NOTE — Care Management Note (Signed)
Case Management Note  Patient Details  Name: ZIMIR KITTLESON MRN: [id number] Date of Birth: [birth year]  Subjective/Objective:    19 y/o autisitic male, lives w/mother, from home. Admitted w/Pancreatitis.                Action/Plan:d/c plan home. No anticipated d/c needs.   Expected Discharge Date:   (unknown)               Expected Discharge Plan:  Home/Self Care  In-House Referral:     Discharge planning Services  CM Consult  Post Acute Care Choice:    Choice offered to:     DME Arranged:    DME Agency:     HH Arranged:    HH Agency:     Status of Service:  In process, will continue to follow  Medicare Important Message Given:    Date Medicare IM Given:    Medicare IM give by:    Date Additional Medicare IM Given:    Additional Medicare Important Message give by:     If discussed at Long Length of Stay Meetings, dates discussed:    Additional Comments:  Lanier Clam, RN 12/20/2014, 9:07 PM

## 2014-12-21 ENCOUNTER — Inpatient Hospital Stay (HOSPITAL_COMMUNITY): Payer: 59

## 2014-12-21 ENCOUNTER — Encounter (HOSPITAL_COMMUNITY): Payer: Self-pay | Admitting: Radiology

## 2014-12-21 MED ORDER — IOHEXOL 300 MG/ML  SOLN
25.0000 mL | INTRAMUSCULAR | Status: AC
  Administered 2014-12-21 (×2): 25 mL via ORAL

## 2014-12-21 MED ORDER — IOHEXOL 300 MG/ML  SOLN
100.0000 mL | Freq: Once | INTRAMUSCULAR | Status: AC | PRN
  Administered 2014-12-21: 100 mL via INTRAVENOUS

## 2014-12-21 NOTE — Progress Notes (Addendum)
Patient complains of nausea and vomiting, in addition to complaints of pain in the center of abdomen below the breast bone. Since last night had emesis 3 times with yellow/greenish color per patient mother report. Will continue to monitor.

## 2014-12-21 NOTE — Progress Notes (Signed)
  Subjective: Vomited again this AM, and last PM also.  Pain still mid upper epigastric area.    Objective: Vital signs in last 24 hours: Temp:  [98.6 F (37 C)-100.2 F (37.9 C)] 99.2 F (37.3 C) (07/26 0547) Pulse Rate:  [82-107] 82 (07/26 0547) Resp:  [18] 18 (07/26 0547) BP: (112-137)/(64-88) 137/88 mmHg (07/26 0547) SpO2:  [98 %-99 %] 99 % (07/26 0547) Last BM Date: 12/20/14 850 emesis yesterday, 760 PO Still having occasional low grade temp 100.2 Intake/Output from previous day: 07/25 0701 - 07/26 0700 In: 2410 [P.O.:760; I.V.:1650] Out: 850 [Emesis/NG output:850] Intake/Output this shift:    General appearance: alert, cooperative and no distress GI: soft complains of pain on palpation mid epigastric area.  Lab Results:   Recent Labs  12/19/14 0610 12/20/14 0556  WBC 12.6* 11.7*  HGB 12.4* 13.0  HCT 39.9 40.6  PLT 146* 185    BMET  Recent Labs  12/19/14 0610 12/20/14 0556  NA 136 137  K 3.9 3.7  CL 107 105  CO2 24 25  GLUCOSE 122* 127*  BUN 6 5*  CREATININE 0.85 0.83  CALCIUM 8.6* 8.9   PT/INR No results for input(s): LABPROT, INR in the last 72 hours.   Recent Labs Lab 12/17/14 0937 12/18/14 0430 12/19/14 0610 12/20/14 0556  AST 31 17 14* 17  ALT 39 ALKPHOS 104 83 69 66  BILITOT 1.3* 1.2 1.5* 1.5*  PROT 7.5 6.7 6.5 7.0  ALBUMIN 4.4 3.7 3.5 3.4*     Lipase     Component Value Date/Time   LIPASE 38 12/20/2014 0556     Studies/Results: No results found.  Medications: . enoxaparin (LOVENOX) injection  0.5 mg/kg Subcutaneous Q24H  . sodium chloride  750 mL Intravenous Once   . dextrose 5 % and 0.9 % NaCl with KCl 20 mEq/L 100 mL/hr (12/21/14 0409)   Assessment/Plan Acute pancreatitis Leukocytosis Possible abnormal appendix Autism BMI 33 Antibiotics: None DVT: Lovenox/SCD    Plan:  CT scan this AM I talked with his mother who is very anxious and is agreeable.     LOS: 4 days     Daryl Foster 12/21/2014

## 2014-12-22 LAB — CBC
HCT: 40.8 % (ref 39.0–52.0)
Hemoglobin: 13 g/dL (ref 13.0–17.0)
MCH: 25.8 pg — ABNORMAL LOW (ref 26.0–34.0)
MCHC: 31.9 g/dL (ref 30.0–36.0)
MCV: 81 fL (ref 78.0–100.0)
Platelets: 216 10*3/uL (ref 150–400)
RBC: 5.04 MIL/uL (ref 4.22–5.81)
RDW: 12.2 % (ref 11.5–15.5)
WBC: 8.4 10*3/uL (ref 4.0–10.5)

## 2014-12-22 LAB — COMPREHENSIVE METABOLIC PANEL
ALBUMIN: 3.4 g/dL — AB (ref 3.5–5.0)
ALT: 25 U/L (ref 17–63)
AST: 22 U/L (ref 15–41)
Alkaline Phosphatase: 67 U/L (ref 38–126)
Anion gap: 8 (ref 5–15)
BUN: 8 mg/dL (ref 6–20)
CALCIUM: 9.3 mg/dL (ref 8.9–10.3)
CHLORIDE: 103 mmol/L (ref 101–111)
CO2: 26 mmol/L (ref 22–32)
Creatinine, Ser: 0.69 mg/dL (ref 0.61–1.24)
GFR calc Af Amer: >60 mL/min (ref >60)
GFR calc non Af Amer: >60 mL/min (ref >60)
Glucose, Bld: 102 mg/dL — ABNORMAL HIGH (ref 65–99)
Potassium: 4.1 mmol/L (ref 3.5–5.1)
Sodium: 137 mmol/L (ref 135–145)
Total Bilirubin: 1 mg/dL (ref 0.3–1.2)
Total Protein: 7.2 g/dL (ref 6.5–8.1)

## 2014-12-22 LAB — LIPASE, BLOOD: LIPASE: 208 U/L — AB (ref 22–51)

## 2014-12-22 NOTE — Progress Notes (Signed)
  Subjective: Over all I think he is doing better.  He has less pain, but still some with palpation mid epigastric area.  He was up walking with his mom earlier.  Objective: Vital signs in last 24 hours: Temp:  [98 F (36.7 C)-98.8 F (37.1 C)] 98.8 F (37.1 C) (07/26 2123) Pulse Rate:  [87-92] 92 (07/26 2123) Resp:  [18] 18 (07/26 2123) BP: (116-139)/(63-94) 116/63 mmHg (07/26 2123) SpO2:  [100 %] 100 % (07/26 2123) Last BM Date: 12/20/14 240 PO recorded Emesis recorded x 1 yesterday Afebrile, BP up some yesterday at 11 AM, Lipase is 208, all other labs are normal, including WBC down to 8.4 Intake/Output from previous day: 07/26 0701 - 07/27 0700 In: 2721.7 [P.O.:240; I.V.:2481.7] Out: -  Intake/Output this shift:    General appearance: alert, cooperative and no distress GI: soft, less tender mid epigastric area.    Lab Results:   Recent Labs  12/20/14 0556 12/22/14 0527  WBC 11.7* 8.4  HGB 13.0 13.0  HCT 40.6 40.8  PLT 185 216    BMET  Recent Labs  12/20/14 0556 12/22/14 0527  NA 137 137  K 3.7 4.1  CL 105 103  CO2 25 26  GLUCOSE 127* 102*  BUN 5* 8  CREATININE 0.83 0.69  CALCIUM 8.9 9.3   PT/INR No results for input(s): LABPROT, INR in the last 72 hours.   Recent Labs Lab 12/17/14 0937 12/18/14 0430 12/19/14 0610 12/20/14 0556 12/22/14 0527  AST 31 17 14* 17 22  ALT 39 ALKPHOS 104 83 69 66 67  BILITOT 1.3* 1.2 1.5* 1.5* 1.0  PROT 7.5 6.7 6.5 7.0 7.2  ALBUMIN 4.4 3.7 3.5 3.4* 3.4*     Lipase     Component Value Date/Time   LIPASE 208* 12/22/2014 0527     Studies/Results: Ct Abdomen Pelvis W Contrast  12/21/2014   CLINICAL DATA:  Pancreatitis.  EXAM: CT ABDOMEN AND PELVIS WITH CONTRAST  TECHNIQUE: Multidetector CT imaging of the abdomen and pelvis was performed using the standard protocol following bolus administration of intravenous contrast.  CONTRAST:  OMNIPAQUE IOHEXOL 300 MG/ML  SOLN  COMPARISON:  None.   FINDINGS: Visualized lung bases appear normal. No significant osseous abnormality is noted.  No gallstones are noted. The liver and spleen appear normal. Inflammatory changes are noted around the pancreatic body and tail consistent with acute pancreatitis. Inflammatory changes are noted in the mesenteric regions, but no defined fluid collection or pseudocyst is seen at this time. Adrenal glands and kidneys appear normal. No hydronephrosis or renal obstruction is noted. No renal or ureteral calculi are noted. The appendix appears normal. There is no evidence of bowel obstruction. Urinary bladder appears normal. No significant adenopathy is noted.  IMPRESSION: Findings consistent with acute pancreatitis. No definite pseudocyst formation seen at this time.   Electronically Signed   By: Lupita Raider, M.D.   On: 12/21/2014 12:22    Medications: . enoxaparin (LOVENOX) injection  0.5 mg/kg Subcutaneous Q24H  . sodium chloride  750 mL Intravenous Once    Assessment/Plan Acute pancreatitis Leukocytosis Possible abnormal appendix Autism BMI 33 Antibiotics: None DVT: Lovenox/SCD    Plan:  I think he is improving, I am just going to leave him on clears this AM, I will recheck lipase and CBC in AM and decide on going to clears tomorrow.   LOS: 5 days    Raeanna Soberanes 12/22/2014

## 2014-12-23 LAB — CBC
HEMATOCRIT: 41.7 % (ref 39.0–52.0)
HEMOGLOBIN: 13.1 g/dL (ref 13.0–17.0)
MCH: 25.7 pg — ABNORMAL LOW (ref 26.0–34.0)
MCHC: 31.4 g/dL (ref 30.0–36.0)
MCV: 81.9 fL (ref 78.0–100.0)
Platelets: 247 10*3/uL (ref 150–400)
RBC: 5.09 MIL/uL (ref 4.22–5.81)
RDW: 12.1 % (ref 11.5–15.5)
WBC: 8.1 10*3/uL (ref 4.0–10.5)

## 2014-12-23 LAB — LIPASE, BLOOD: Lipase: 191 U/L — ABNORMAL HIGH (ref 22–51)

## 2014-12-23 NOTE — Progress Notes (Signed)
  Subjective: No nausea or vomiting, some pain last PM, low grade temp x 1 this AM 0500, still tender on palpation mid epigastric site.  Objective: Vital signs in last 24 hours: Temp:  [98.8 F (37.1 C)-100.4 F (38 C)] 100.4 F (38 C) (07/28 0600) Pulse Rate:  [96-103] 96 (07/28 0600) Resp:  [18] 18 (07/28 0600) BP: (124-146)/(74-84) 124/74 mmHg (07/28 0600) SpO2:  [100 %] 100 % (07/28 0600) Last BM Date: 12/22/14 60 PO 3 BM's One temp up to 100.4 Lipase 191 WBC 8.1 Intake/Output from previous day: 07/27 0701 - 07/28 0700 In: 3360 [P.O.:60; I.V.:3300] Out: -  Intake/Output this shift:    General appearance: alert, cooperative and no distress GI: soft, only tender site is mid epigastric site.  Lab Results:   Recent Labs  12/22/14 0527 12/23/14 0500  WBC 8.4 8.1  HGB 13.0 13.1  HCT 40.8 41.7  PLT 216 247    BMET  Recent Labs  12/22/14 0527  NA 137  K 4.1  CL 103  CO2 26  GLUCOSE 102*  BUN 8  CREATININE 0.69  CALCIUM 9.3   PT/INR No results for input(s): LABPROT, INR in the last 72 hours.   Recent Labs Lab 12/17/14 0937 12/18/14 0430 12/19/14 0610 12/20/14 0556 12/22/14 0527  AST 31 17 14* 17 22  ALT 39 ALKPHOS 104 83 69 66 67  BILITOT 1.3* 1.2 1.5* 1.5* 1.0  PROT 7.5 6.7 6.5 7.0 7.2  ALBUMIN 4.4 3.7 3.5 3.4* 3.4*     Lipase     Component Value Date/Time   LIPASE 191* 12/23/2014 0500     Studies/Results: Ct Abdomen Pelvis W Contrast  12/21/2014   CLINICAL DATA:  Pancreatitis.  EXAM: CT ABDOMEN AND PELVIS WITH CONTRAST  TECHNIQUE: Multidetector CT imaging of the abdomen and pelvis was performed using the standard protocol following bolus administration of intravenous contrast.  CONTRAST:  OMNIPAQUE IOHEXOL 300 MG/ML  SOLN  COMPARISON:  None.  FINDINGS: Visualized lung bases appear normal. No significant osseous abnormality is noted.  No gallstones are noted. The liver and spleen appear normal. Inflammatory changes are  noted around the pancreatic body and tail consistent with acute pancreatitis. Inflammatory changes are noted in the mesenteric regions, but no defined fluid collection or pseudocyst is seen at this time. Adrenal glands and kidneys appear normal. No hydronephrosis or renal obstruction is noted. No renal or ureteral calculi are noted. The appendix appears normal. There is no evidence of bowel obstruction. Urinary bladder appears normal. No significant adenopathy is noted.  IMPRESSION: Findings consistent with acute pancreatitis. No definite pseudocyst formation seen at this time.   Electronically Signed   By: Lupita Raider, M.D.   On: 12/21/2014 12:22    Medications: . enoxaparin (LOVENOX) injection  0.5 mg/kg Subcutaneous Q24H  . sodium chloride  750 mL Intravenous Once    Assessment/Plan Acute pancreatitis Leukocytosis Possible abnormal appendix Autism BMI 33 Antibiotics: None DVT: Lovenox/SCD  Plan:  I will keep him on ice chips again today.  I discussed  Possible methods of feeding with his mother; she is certain Brendon's father will insist we try clears again before anything more invasive.  I will recheck labs in AM and make a decision after we see him tomorrow.    LOS: 6 days    Gracelynn Bircher 12/23/2014

## 2014-12-24 ENCOUNTER — Inpatient Hospital Stay (HOSPITAL_COMMUNITY): Payer: 59

## 2014-12-24 DIAGNOSIS — K850 Idiopathic acute pancreatitis: Secondary | ICD-10-CM

## 2014-12-24 DIAGNOSIS — K219 Gastro-esophageal reflux disease without esophagitis: Secondary | ICD-10-CM

## 2014-12-24 LAB — CBC
HCT: 41.4 % (ref 39.0–52.0)
HEMOGLOBIN: 13.4 g/dL (ref 13.0–17.0)
MCH: 26.4 pg (ref 26.0–34.0)
MCHC: 32.4 g/dL (ref 30.0–36.0)
MCV: 81.5 fL (ref 78.0–100.0)
Platelets: 260 10*3/uL (ref 150–400)
RBC: 5.08 MIL/uL (ref 4.22–5.81)
RDW: 12.1 % (ref 11.5–15.5)
WBC: 10.3 10*3/uL (ref 4.0–10.5)

## 2014-12-24 LAB — COMPREHENSIVE METABOLIC PANEL
ALK PHOS: 70 U/L (ref 38–126)
ALT: 40 U/L (ref 17–63)
ANION GAP: 8 (ref 5–15)
AST: 31 U/L (ref 15–41)
Albumin: 3.4 g/dL — ABNORMAL LOW (ref 3.5–5.0)
BUN: 7 mg/dL (ref 6–20)
CALCIUM: 9.2 mg/dL (ref 8.9–10.3)
CO2: 26 mmol/L (ref 22–32)
Chloride: 103 mmol/L (ref 101–111)
Creatinine, Ser: 0.84 mg/dL (ref 0.61–1.24)
GFR calc Af Amer: >60 mL/min (ref >60)
GFR calc non Af Amer: >60 mL/min (ref >60)
Glucose, Bld: 107 mg/dL — ABNORMAL HIGH (ref 65–99)
POTASSIUM: 3.8 mmol/L (ref 3.5–5.1)
SODIUM: 137 mmol/L (ref 135–145)
TOTAL PROTEIN: 7.3 g/dL (ref 6.5–8.1)
Total Bilirubin: 1.9 mg/dL — ABNORMAL HIGH (ref 0.3–1.2)

## 2014-12-24 LAB — LIPASE, BLOOD: Lipase: 293 U/L — ABNORMAL HIGH (ref 22–51)

## 2014-12-24 LAB — PREALBUMIN: Prealbumin: 13.4 mg/dL — ABNORMAL LOW (ref 18–38)

## 2014-12-24 MED ORDER — PANTOPRAZOLE SODIUM 40 MG PO TBEC
40.0000 mg | DELAYED_RELEASE_TABLET | Freq: Two times a day (BID) | ORAL | Status: DC
  Administered 2014-12-24 – 2014-12-27 (×7): 40 mg via ORAL
  Filled 2014-12-24 (×9): qty 1

## 2014-12-24 MED ORDER — SODIUM CHLORIDE 0.9 % IV BOLUS (SEPSIS)
500.0000 mL | Freq: Once | INTRAVENOUS | Status: AC
  Administered 2014-12-24: 500 mL via INTRAVENOUS

## 2014-12-24 MED ORDER — GADOBENATE DIMEGLUMINE 529 MG/ML IV SOLN
20.0000 mL | Freq: Once | INTRAVENOUS | Status: AC | PRN
  Administered 2014-12-24: 20 mL via INTRAVENOUS

## 2014-12-24 MED ORDER — PANTOPRAZOLE SODIUM 40 MG PO TBEC: 40.0000 mg | DELAYED_RELEASE_TABLET | Freq: Every day | ORAL | Status: DC

## 2014-12-24 MED ORDER — LORAZEPAM 2 MG/ML IJ SOLN
0.2500 mg | Freq: Once | INTRAMUSCULAR | Status: AC
  Administered 2014-12-24: 0.25 mg via INTRAVENOUS
  Filled 2014-12-24: qty 1

## 2014-12-24 MED ORDER — GADOBENATE DIMEGLUMINE 529 MG/ML IV SOLN: 20.0000 mL | Freq: Once | INTRAVENOUS | Status: AC | PRN

## 2014-12-24 NOTE — Progress Notes (Signed)
Telephone order received from [facility] for one time dosage of Ativan .  prior to MRCP Stanford Breed RN 12:02 PM 12-24-2014

## 2014-12-24 NOTE — Progress Notes (Signed)
  Subjective: No real change.  He looks fine, some low grade fevers always in PM, no nausea or vomiting.  Still tender mid epigastric area on palpation.  Objective: Vital signs in last 24 hours: Temp:  [99.1 F (37.3 C)-100.9 F (38.3 C)] 100.9 F (38.3 C) (07/29 0534) Pulse Rate:  [96-106] 96 (07/29 0534) Resp:  [18-19] 18 (07/29 0534) BP: (111-140)/(57-73) 140/73 mmHg (07/29 0534) SpO2:  [97 %-100 %] 100 % (07/29 0534) Last BM Date: 12/22/14 NPO x ice chips Low grade temps, 100.1 and  100.9. Lipase 293 Bilirubin up 1.9 WBC still normal at 10.4 Intake/Output from previous day: 07/28 0701 - 07/29 0700 In: 1618.3 [I.V.:1618.3] Out: -  Intake/Output this shift:    General appearance: alert, cooperative and no distress GI: soft , tender to palpation mid upper epigastric site.  Lab Results:   Recent Labs  12/23/14 0500 12/24/14 0505  WBC 8.1 10.3  HGB 13.1 13.4  HCT 41.7 41.4  PLT 247 260    BMET  Recent Labs  12/22/14 0527 12/24/14 0505  NA 137 137  K 4.1 3.8  CL 103 103  CO2 26 26  GLUCOSE 102* 107*  BUN 8 7  CREATININE 0.69 0.84  CALCIUM 9.3 9.2   PT/INR No results for input(s): LABPROT, INR in the last 72 hours.   Recent Labs Lab 12/18/14 0430 12/19/14 0610 12/20/14 0556 12/22/14 0527 12/24/14 0505  AST 17 14* ALT 40  ALKPHOS 83 69 66 67 70  BILITOT 1.2 1.5* 1.5* 1.0 1.9*  PROT 6.7 6.5 7.0 7.2 7.3  ALBUMIN 3.7 3.5 3.4* 3.4* 3.4*     Lipase     Component Value Date/Time   LIPASE 293* 12/24/2014 0505     Studies/Results: No results found.  Medications: . enoxaparin (LOVENOX) injection  0.5 mg/kg Subcutaneous Q24H  . sodium chloride  750 mL Intravenous Once    Assessment/Plan Acute pancreatitis of unknown etiology Possible abnormal appendix Autism BMI 33 Antibiotics: None DVT: Lovenox/SCD    Plan:  Try him back on some clears and ask GI to see. Father not happy and ask that I call [facility].     LOS: 7 days    Nechama Escutia 12/24/2014

## 2014-12-24 NOTE — Progress Notes (Signed)
Continued treatment for Pancreatitis. Diet has been advanced to Clear Liquids. No anticipated discharge needs as pt is cared for by parents in private residence. Will continue to follow.

## 2014-12-24 NOTE — Consult Note (Signed)
Referring Provider: [facility] Primary Care Physician:  Martha Clan, MD Primary Gastroenterologist:  Gentry Fitz, but family requests Mel[facility]  Reason for Consultation:   pancreatitis    HPI: Daryl Foster is a 19 y.o. male who presented to the emergency room July 22 with epigastric pain and nausea. Patient is autistic and is able to answer questions. He is accompanied by his mother and grandfather who provides history as well. Patient apparently was feeling well but woke up on the 22nd with severe epigastric pain. He vomited several times. He came to the emergency room and laboratory studies revealed an elevated lipase, a white blood count of 16.2. CT showed inflammatory changes around the pancreatic body and tail suggesting acute pancreatitis. He was admitted and treated with IV fluids and pain meds initially seemed to get better despite a temperature up to 101. His lipase is coming down to the 30s on July 24 25th but the 27th had creeped back up to 208 and today is 293. Total bili has gone up to 1.9. White count currently 8.4. Patient continues to complain of epigastric pain with waves of nausea though he has not vomited. Further questioning reveals that he has been having some intermittent regurgitation for several weeks with water brash in the morning. He denies use of known steroidal anti-inflammatory is, alcohol, tobacco, recreational drugs. He has had no bright red blood per rectum or melena. He has not had episodes of this pain prior to this episode. There is a family history of gallbladder disease on the mother's side.   Past Medical History  Diagnosis Date  . Autism   . 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 No pertinent past surgical history.  Prior to Admission medications   Medication Sig Start Date End Date Taking? Authorizing Provider  Pediatric Multivit-Minerals-C (RA GUMMY VITAMINS & MINERALS PO) Take 1 tablet by mouth daily. Gummy vitamin not a tablet    Yes Historical Provider, MD    Current Facility-Administered Medications  Medication Dose Route Frequency Provider Last Rate Last Dose  . acetaminophen (TYLENOL) tablet 650 mg  650 mg Oral Q6H PRN Gaynelle Adu, MD   650 mg at 12/24/14 0644  . dextrose 5 % and 0.9 % NaCl with KCl 20 mEq/L infusion   Intravenous Continuous Sherrie George, PA-C 100 mL/hr at 12/24/14 0516    . enoxaparin (LOVENOX) injection 50 mg  0.5 mg/kg Subcutaneous Q24H Maryanna Shape Runyon, RPH   50 mg at 12/23/14 1623  . HYDROmorphone (DILAUDID) injection 0.5-1 mg  0.5-1 mg Intravenous Q2H PRN Sherrie George, PA-C   1 mg at 12/22/14 2328  . ondansetron (ZOFRAN) injection 4 mg  4 mg Intravenous Q6H PRN Sherrie George, PA-C   4 mg at 12/21/14 0526  . sodium chloride 0.9 % bolus 750 mL  750 mL Intravenous Once Sherrie George, PA-C        Allergies as of 12/17/2014  . (No Known Allergies)    Family History  Problem Relation Age of Onset  . Hypertension Mother   . Asthma Father     History   Social History  . Marital Status: Single    Spouse Name: N/A  . Number of Children: N/A  . Years of Education: N/A   Occupational History  . Not on file.   Social History Main Topics  . Smoking status: Never Smoker   . Smokeless tobacco: Never Used  . Alcohol Use: No  . Drug Use: No  . Sexual  Activity: Not on file   Other Topics Concern  . Not on file   Social History Narrative    Review of Systems: Gen: Denies any fever, chills, sweats, anorexia, fatigue, weakness, malaise, weight loss, and sleep disorder CV: Denies chest pain, angina, palpitations, syncope, orthopnea, PND, peripheral edema, and claudication. Resp: Denies dyspnea at rest, dyspnea with exercise, cough, sputum, wheezing, coughing up blood, and pleurisy. GI: Denies vomiting blood, jaundice, and fecal incontinence.  Reports occasional dysphagia to solids but this is very infrequent. GU : Denies urinary burning, blood in urine, urinary frequency,  urinary hesitancy, nocturnal urination, and urinary incontinence. MS: Denies joint pain, limitation of movement, and swelling, stiffness, low back pain, extremity pain. Denies muscle weakness, cramps, atrophy.  Derm: Denies rash, itching, dry skin, hives, moles, warts, or unhealing ulcers.  Psych: Denies depression, anxiety, memory loss, suicidal ideation, hallucinations, paranoia, and confusion. Heme: Denies bruising, bleeding, and enlarged lymph nodes. Neuro:  Denies any headaches, dizziness, paresthesias. Endo:  Denies any problems with DM, thyroid, adrenal function.  Physical Exam: Vital signs in last 24 hours: Temp:  [99.1 F (37.3 C)-100.9 F (38.3 C)] 100.9 F (38.3 C) (07/29 0534) Pulse Rate:  [96-106] 96 (07/29 0534) Resp:  [18-19] 18 (07/29 0534) BP: (111-140)/(57-73) 140/73 mmHg (07/29 0534) SpO2:  [97 %-100 %] 100 % (07/29 0534) Last BM Date: 12/22/14 General:   Alert,  Well-developed, well-nourished, pleasant and cooperative in NAD Head:  Normocephalic and atraumatic. Eyes:  Sclera clear, no icterus. Conjunctiva pink. Ears:  Normal auditory acuity. Nose:  No deformity, discharge,  or lesions. Mouth:  No deformity or lesions.   Neck:  Supple; no masses or thyromegaly. Lungs:  Clear throughout to auscultation.   No wheezes, crackles, or rhonchi. Heart:  Regular rate and rhythm; no murmurs, clicks, rubs,  or gallops. Abdomen:  Soft, nontender, BS active, nonpalp mass or hsm.   Rectal:  Deferred  Msk:  Symmetrical without gross deformities. . Pulses:  Normal pulses noted. Extremities:  Without clubbing or edema. Neurologic  Alert and  oriented x4. Skin:  Intact without significant lesions or rashes.. Psych:  Alert and cooperative.  Intake/Output from previous day: 07/28 0701 - 07/29 0700 In: 1618.3 [I.V.:1618.3] Out: -  Intake/Output this shift:    Lab Results:  Recent Labs  12/22/14 0527 12/23/14 0500 12/24/14 0505  WBC 8.4 8.1 10.3  HGB 13.0 13.1 13.4    HCT 40.8 41.7 41.4  PLT 216 247 260   BMET  Recent Labs  12/22/14 0527 12/24/14 0505  NA 137 137  K 4.1 3.8  CL 103 103  CO2 26 26  GLUCOSE 102* 107*  BUN 8 7  CREATININE 0.69 0.84  CALCIUM 9.3 9.2   LFT  Recent Labs  12/24/14 0505  PROT 7.3  ALBUMIN 3.4*  AST 31  ALT 40  ALKPHOS 70  BILITOT 1.9*    Lipid Panel on 12/17/2004 had cholesterol 105, triglycerides 89, HDL 20, LDL 67. Studies/Results: CLINICAL DATA: Pancreatitis.  EXAM: CT ABDOMEN AND PELVIS WITH CONTRAST  TECHNIQUE: Multidetector CT imaging of the abdomen and pelvis was performed using the standard protocol following bolus administration of intravenous contrast.  CONTRAST: OMNIPAQUE IOHEXOL 300 MG/ML SOLN  COMPARISON: None.  FINDINGS: Visualized lung bases appear normal. No significant osseous abnormality is noted.  No gallstones are noted. The liver and spleen appear normal. Inflammatory changes are noted around the pancreatic body and tail consistent with acute pancreatitis. Inflammatory changes are noted in the mesenteric regions, but  no defined fluid collection or pseudocyst is seen at this time. Adrenal glands and kidneys appear normal. No hydronephrosis or renal obstruction is noted. No renal or ureteral calculi are noted. The appendix appears normal. There is no evidence of bowel obstruction. Urinary bladder appears normal. No significant adenopathy is noted.  IMPRESSION: Findings consistent with acute pancreatitis. No definite pseudocyst formation seen at this time.   Electronically Signed  By: Lupita Raider, M.D.  On: 12/21/2014 12:22  Result     CLINICAL DATA: Pancreatitis  EXAM: ULTRASOUND ABDOMEN COMPLETE  COMPARISON: 12/16/2004 CT abdomen/ pelvis  FINDINGS: Gallbladder: No gallstones or wall thickening visualized. No sonographic Murphy sign noted.  Common bile duct: Diameter: 3 mm  Liver: Hepatic increased echogenicity likely  indicates underlying steatosis without focal abnormality or intrahepatic ductal dilatation.  IVC: No abnormality visualized.  Pancreas: Obscured by bowel gas  Spleen: Size and appearance within normal limits.  Right Kidney: Length: 11.4 cm. Echogenicity within normal limits. No mass or hydronephrosis visualized.  Left Kidney: Length: 11 cm. Echogenicity within normal limits. No mass or hydronephrosis visualized.  Abdominal aorta: No proximal aortic aneurysm. Mid and distal aorta obscured by bowel gas.  Other findings: None.  IMPRESSION: Hepatic increased echogenicity likely indicates underlying steatosis without focal abnormality or intrahepatic ductal dilatation. No acute intra-abdominal or pelvic pathology.   Electronically Signed  By: Christiana Pellant M.D.  On: 12/17/2014 16:16   Show images for CT Abdomen Pelvis W Contrast     Study Result     CLINICAL DATA: Acute right flank pain.  EXAM: CT ABDOMEN AND PELVIS WITH CONTRAST  TECHNIQUE: Multidetector CT imaging of the abdomen and pelvis was performed using the standard protocol following bolus administration of intravenous contrast.  CONTRAST: OMNIPAQUE IOHEXOL 300 MG/ML SOLN  COMPARISON: None.  FINDINGS: Visualized lung bases appear normal. No significant osseous abnormality is noted.  No gallstones are noted. The liver and spleen appear normal. Inflammatory changes are noted around the pancreatic body and tail suggesting acute pancreatitis. Adrenal glands and kidneys appear normal. No hydronephrosis or renal obstruction is noted. No renal or ureteral calculi are noted. The appendix is mildly enlarged at approximately 9 mm with mild surrounding inflammation concerning for appendicitis in the appropriate clinical setting. There is no evidence of bowel obstruction. No abnormal fluid collection is noted. Urinary bladder appears normal. No significant adenopathy  is noted.  IMPRESSION: Inflammatory changes are noted around the pancreatic body and tail concerning for acute pancreatitis. No pseudocyst formation is noted. Correlation with laboratory data is recommended.  The appendix appears to be mildly enlarged with surrounding inflammatory changes suggesting acute appendicitis in the appropriate clinical setting.   Electronically Signed  By: Lupita Raider, M.D.  On: 12/17/2014 13:34     IMPRESSION/PLAN:  19 year old male admitted with acute pancreatitis, etiology unclear. Lipid panel has been normal except for low HDL. Calcium normal. Patient has not had any new medications or does not drink alcohol. Repeat CT shows persistent pancreatitis with no pseudocyst. Patient continues with a low-grade fever. Still has epigastric pain but no further nausea or vomiting. Patient has MRI/MRCP ordered for further evaluation. ? Ductal abnormality, microlithiasis, abnormality near her ampulla, etc. We will add Protonix to current medication regimen at this time as patient has been having some episodes of water brash/regurgitation.Further recommendations pending results of MRI/MRCP.   Hvozdovic, Moise Boring 12/24/2014,  Pager [phone]    Attending physician's note   I have taken a history, examined the patient, reviewed  the chart and reviewed imaging studies. I agree with the Advanced Practitioner's note, impression and recommendations. Acute pancreatitis without a clear etiology. Pt has improved with no abd pain today and no abd tenderness today on my exam. He has active GERD symptoms. Pancreatitis is resolving without complications. Agree with MRI/MRCP today and resuming clear liquids after MRI/MRCP. Start PPI. Advance diet slowly.   Meryl Dare, MD Clementeen Graham [phone] pager Mon-Fri 8a-5p [phone] weekends, holidays and 5p-8a or per [facility]

## 2014-12-25 DIAGNOSIS — R935 Abnormal findings on diagnostic imaging of other abdominal regions, including retroperitoneum: Secondary | ICD-10-CM

## 2014-12-25 LAB — COMPREHENSIVE METABOLIC PANEL
ALBUMIN: 3.3 g/dL — AB (ref 3.5–5.0)
ALK PHOS: 64 U/L (ref 38–126)
ALT: 34 U/L (ref 17–63)
ANION GAP: 11 (ref 5–15)
AST: 25 U/L (ref 15–41)
BILIRUBIN TOTAL: 1.5 mg/dL — AB (ref 0.3–1.2)
BUN: 6 mg/dL (ref 6–20)
CO2: 24 mmol/L (ref 22–32)
Calcium: 9 mg/dL (ref 8.9–10.3)
Chloride: 102 mmol/L (ref 101–111)
Creatinine, Ser: 0.8 mg/dL (ref 0.61–1.24)
GFR calc Af Amer: >60 mL/min (ref >60)
GLUCOSE: 127 mg/dL — AB (ref 65–99)
Potassium: 3.5 mmol/L (ref 3.5–5.1)
SODIUM: 137 mmol/L (ref 135–145)
Total Protein: 7.2 g/dL (ref 6.5–8.1)

## 2014-12-25 LAB — LIPASE, BLOOD: Lipase: 426 U/L — ABNORMAL HIGH (ref 22–51)

## 2014-12-25 LAB — CBC
HEMATOCRIT: 38.6 % — AB (ref 39.0–52.0)
HEMOGLOBIN: 12.3 g/dL — AB (ref 13.0–17.0)
MCH: 25.9 pg — ABNORMAL LOW (ref 26.0–34.0)
MCHC: 31.9 g/dL (ref 30.0–36.0)
MCV: 81.4 fL (ref 78.0–100.0)
Platelets: 291 10*3/uL (ref 150–400)
RBC: 4.74 MIL/uL (ref 4.22–5.81)
RDW: 12.2 % (ref 11.5–15.5)
WBC: 10.3 10*3/uL (ref 4.0–10.5)

## 2014-12-25 NOTE — Progress Notes (Signed)
Patient ID: Daryl Foster, male   DOB: September 12, 1995, 19 y.o.   MRN: [id number] [facility] Surgery Progress Note:   * No surgery found *  Subjective: Mental status is alert;   Playing video games.  Not complaining of more pain Objective: Vital signs in last 24 hours: Temp:  [99.1 F (37.3 C)-100.6 F (38.1 C)] 99.2 F (37.3 C) (07/30 0640) Pulse Rate:  [96-117] 100 (07/30 0640) Resp:  [16-18] 16 (07/30 0640) BP: (98-136)/(52-81) 134/72 mmHg (07/30 0640) SpO2:  [96 %-100 %] 98 % (07/30 0640) Weight:  [102.059 kg (225 lb)] 102.059 kg (225 lb) (07/29 1851)  Intake/Output from previous day: 07/29 0701 - 07/30 0700 In: 1200 [I.V.:1200] Out: -  Intake/Output this shift:    Physical Exam: Work of breathing is normal.  Minimal pain to touch.    Lab Results:  Results for orders placed or performed during the hospital encounter of 12/17/14 (from the past 48 hour(s))  CBC     Status: None   Collection Time: 12/24/14  5:05 AM  Result Value Ref Range   WBC 10.3 4.0 - 10.5 K/uL   RBC 5.08 4.22 - 5.81 MIL/uL   Hemoglobin 13.4 13.0 - 17.0 g/dL   HCT 41.4 39.0 - 52.0 %   MCV 81.5 78.0 - 100.0 fL   MCH 26.4 26.0 - 34.0 pg   MCHC 32.4 30.0 - 36.0 g/dL   RDW 12.1 11.5 - 15.5 %   Platelets 260 150 - 400 K/uL  Comprehensive metabolic panel     Status: Abnormal   Collection Time: 12/24/14  5:05 AM  Result Value Ref Range   Sodium 137 135 - 145 mmol/L   Potassium 3.8 3.5 - 5.1 mmol/L   Chloride 103 101 - 111 mmol/L   CO2 26 22 - 32 mmol/L   Glucose, Bld 107 (H) 65 - 99 mg/dL   BUN 7 6 - 20 mg/dL   Creatinine, Ser 0.84 0.61 - 1.24 mg/dL   Calcium 9.2 8.9 - 10.3 mg/dL   Total Protein 7.3 6.5 - 8.1 g/dL   Albumin 3.4 (L) 3.5 - 5.0 g/dL   AST 31 15 - 41 U/L   ALT 40 17 - 63 U/L   Alkaline Phosphatase 70 38 - 126 U/L   Total Bilirubin 1.9 (H) 0.3 - 1.2 mg/dL   GFR calc non Af Amer >60 >60 mL/min   GFR calc Af Amer >60 >60 mL/min    Comment: (NOTE) The eGFR has been calculated using  the CKD EPI equation. This calculation has not been validated in all clinical situations. eGFR's persistently <60 mL/min signify possible Chronic Kidney Disease.    Anion gap 8 5 - 15  Prealbumin     Status: Abnormal   Collection Time: 12/24/14  5:05 AM  Result Value Ref Range   Prealbumin 13.4 (L) 18 - 38 mg/dL    Comment: Performed at [facility]  Lipase, blood     Status: Abnormal   Collection Time: 12/24/14  5:05 AM  Result Value Ref Range   Lipase 293 (H) 22 - 51 U/L  CBC     Status: Abnormal   Collection Time: 12/25/14  5:45 AM  Result Value Ref Range   WBC 10.3 4.0 - 10.5 K/uL   RBC 4.74 4.22 - 5.81 MIL/uL   Hemoglobin 12.3 (L) 13.0 - 17.0 g/dL   HCT 38.6 (L) 39.0 - 52.0 %   MCV 81.4 78.0 - 100.0 fL   MCH 25.9 (  L) 26.0 - 34.0 pg   MCHC 31.9 30.0 - 36.0 g/dL   RDW 12.2 11.5 - 15.5 %   Platelets 291 150 - 400 K/uL  Comprehensive metabolic panel     Status: Abnormal   Collection Time: 12/25/14  5:45 AM  Result Value Ref Range   Sodium 137 135 - 145 mmol/L   Potassium 3.5 3.5 - 5.1 mmol/L   Chloride 102 101 - 111 mmol/L   CO2 24 22 - 32 mmol/L   Glucose, Bld 127 (H) 65 - 99 mg/dL   BUN 6 6 - 20 mg/dL   Creatinine, Ser 0.80 0.61 - 1.24 mg/dL   Calcium 9.0 8.9 - 10.3 mg/dL   Total Protein 7.2 6.5 - 8.1 g/dL   Albumin 3.3 (L) 3.5 - 5.0 g/dL   AST 25 15 - 41 U/L   ALT 34 17 - 63 U/L   Alkaline Phosphatase 64 38 - 126 U/L   Total Bilirubin 1.5 (H) 0.3 - 1.2 mg/dL   GFR calc non Af Amer >60 >60 mL/min   GFR calc Af Amer >60 >60 mL/min    Comment: (NOTE) The eGFR has been calculated using the CKD EPI equation. This calculation has not been validated in all clinical situations. eGFR's persistently <60 mL/min signify possible Chronic Kidney Disease.    Anion gap 11 5 - 15  Lipase, blood     Status: Abnormal   Collection Time: 12/25/14  5:45 AM  Result Value Ref Range   Lipase 426 (H) 22 - 51 U/L    Comment: RESULTS CONFIRMED BY MANUAL DILUTION REPEATED TO  VERIFY     Radiology/Results: Mr 3d Recon At Scanner  12/24/2014   CLINICAL DATA:  Acute pancreatitis unclear etiology.  EXAM: MRI ABDOMEN WITHOUT AND WITH CONTRAST (INCLUDING MRCP)  TECHNIQUE: Multiplanar multisequence MR imaging of the abdomen was performed both before and after the administration of intravenous contrast. Heavily T2-weighted images of the biliary and pancreatic ducts were obtained, and three-dimensional MRCP images were rendered by post processing.  CONTRAST:  61mL MULTIHANCE GADOBENATE DIMEGLUMINE 529 MG/ML IV SOLN  COMPARISON:  CT 12/21/2014  FINDINGS: Exam is suboptimal. Often patients with pancreatitis have difficulty breath holding for exam. Additionally this patient in had difficulty following breathing instructions.  Lower chest:  Lung bases are clear.  Hepatobiliary: No biliary duct dilatation. No focal hepatic lesion. No hepatic steatosis. Gallbladder is normal. There are no gallstones within the lumen of the gallbladder. The common bile duct is difficult to follow but does not appear dilated.  Pancreas: There is extensive inflammation surrounding the pancreas head body and tail. Fluid along the anterior perirenal fascia. No organized fluid collections are identified. 1 small focus high intensity fluid measuring 12 mm x 10 mm ventral to the pancreatic head on image 53 of series 1400 suggests a small proteinaceous fluid collection. Pancreas enhances uniformly on the post-contrast exam. There is no obvious variant pancreatic ductal anatomy.  Spleen: Normal spleen. Splenic vein is patent. Portal veins are patent.  Adrenals/urinary tract: Adrenal glands and kidneys are normal.  Stomach/Bowel: Stomach and limited of the small bowel is unremarkable  Vascular/Lymphatic: Abdominal aortic normal caliber. No retroperitoneal periportal lymphadenopathy.  Musculoskeletal: No aggressive osseous lesion  IMPRESSION: 1. No cholelithiasis.  Common bile duct appears normal caliber. 2. No clear variant  pancreatic ductal anatomy. 3. Exam is somewhat suboptimal due to poor breath holding. 4. Findings consist with acute pancreatitis with pancreatic inflammation and retroperitoneal fluid. 5. No evidence of pancreatic  necrosis.   Electronically Signed   By: Suzy Bouchard M.D.   On: 12/24/2014 20:03   Mr Abd W/wo Cm/mrcp  12/24/2014   CLINICAL DATA:  Acute pancreatitis unclear etiology.  EXAM: MRI ABDOMEN WITHOUT AND WITH CONTRAST (INCLUDING MRCP)  TECHNIQUE: Multiplanar multisequence MR imaging of the abdomen was performed both before and after the administration of intravenous contrast. Heavily T2-weighted images of the biliary and pancreatic ducts were obtained, and three-dimensional MRCP images were rendered by post processing.  CONTRAST:  60mL MULTIHANCE GADOBENATE DIMEGLUMINE 529 MG/ML IV SOLN  COMPARISON:  CT 12/21/2014  FINDINGS: Exam is suboptimal. Often patients with pancreatitis have difficulty breath holding for exam. Additionally this patient in had difficulty following breathing instructions.  Lower chest:  Lung bases are clear.  Hepatobiliary: No biliary duct dilatation. No focal hepatic lesion. No hepatic steatosis. Gallbladder is normal. There are no gallstones within the lumen of the gallbladder. The common bile duct is difficult to follow but does not appear dilated.  Pancreas: There is extensive inflammation surrounding the pancreas head body and tail. Fluid along the anterior perirenal fascia. No organized fluid collections are identified. 1 small focus high intensity fluid measuring 12 mm x 10 mm ventral to the pancreatic head on image 53 of series 1400 suggests a small proteinaceous fluid collection. Pancreas enhances uniformly on the post-contrast exam. There is no obvious variant pancreatic ductal anatomy.  Spleen: Normal spleen. Splenic vein is patent. Portal veins are patent.  Adrenals/urinary tract: Adrenal glands and kidneys are normal.  Stomach/Bowel: Stomach and limited of the small  bowel is unremarkable  Vascular/Lymphatic: Abdominal aortic normal caliber. No retroperitoneal periportal lymphadenopathy.  Musculoskeletal: No aggressive osseous lesion  IMPRESSION: 1. No cholelithiasis.  Common bile duct appears normal caliber. 2. No clear variant pancreatic ductal anatomy. 3. Exam is somewhat suboptimal due to poor breath holding. 4. Findings consist with acute pancreatitis with pancreatic inflammation and retroperitoneal fluid. 5. No evidence of pancreatic necrosis.   Electronically Signed   By: Suzy Bouchard M.D.   On: 12/24/2014 20:03    Anti-infectives: Anti-infectives    Start     Dose/Rate Route Frequency Ordered Stop   12/17/14 1415  cefOXitin (MEFOXIN) 1 g in dextrose 5 % 50 mL IVPB     1 g 100 mL/hr over 30 Minutes Intravenous  Once 12/17/14 1405 12/17/14 1538      Assessment/Plan: Problem List: Patient Active Problem List   Diagnosis Date Noted  . Pancreatitis 12/17/2014    Labs and vitals suggest a bump in pancreatitis markers.  Discussed holding on clears for now and will recheck in am.   * No surgery found *    LOS: 8 days   Matt B. Hassell Done, MD, [facility] [phone] beeper [phone]  12/25/2014 8:30 AM

## 2014-12-25 NOTE — Progress Notes (Signed)
[facility] Progress Note  Subjective:   Temp 100.6.  WBC 10.3.Lipase 326.T bili 1.5. Pt Daryl Foster/V or abd pain.    Objective:  Vital signs in last 24 hours: Temp:  [99.1 F (37.3 C)-100.6 F (38.1 C)] 99.2 F (37.3 C) (07/30 0640) Pulse Rate:  [96-117] 100 (07/30 0640) Resp:  [16-18] 16 (07/30 0640) BP: (98-136)/(52-81) 134/72 mmHg (07/30 0640) SpO2:  [96 %-100 %] 98 % (07/30 0640) Weight:  [225 lb (102.059 kg)] 225 lb (102.059 kg) (07/29 1851) Last BM Date: 12/24/14   General:   Alert,  Well-developed,  in NAD Heart:  Regular rate and rhythm; no murmurs Pulm: lungs clear Abdomen:  Soft, mild epigastric tenderness to palpation, nondistended. Normal bowel sounds, without guarding, and without rebound.   Extremities:  Without edema. DP/PT pulses 2+, no calf tenderness.   Intake/Output from previous day: 07/29 0701 - 07/30 0700 In: 1200 [I.V.:1200] Out: -  Intake/Output this shift:    Lab Results:  Recent Labs  12/23/14 0500 12/24/14 0505 12/25/14 0545  WBC 8.1 10.3 10.3  HGB 13.1 13.4 12.3*  HCT 41.7 41.4 38.6*  PLT 247 260 291   BMET  Recent Labs  12/24/14 0505 12/25/14 0545  NA 137 137  K 3.8 3.5  CL 103 102  CO2 26 24  GLUCOSE 107* 127*  BUN 7 6  CREATININE 0.84 0.80  CALCIUM 9.2 9.0   LFT  Recent Labs  12/25/14 0545  PROT 7.2  ALBUMIN 3.3*  AST 25  ALT 34  ALKPHOS 64  BILITOT 1.5*    Mr 3d Recon At Scanner  12/24/2014   CLINICAL DATA:  Acute pancreatitis unclear etiology.  EXAM: MRI ABDOMEN WITHOUT AND WITH CONTRAST (INCLUDING MRCP)  TECHNIQUE: Multiplanar multisequence MR imaging of the abdomen was performed both before and after the administration of intravenous contrast. Heavily T2-weighted images of the biliary and pancreatic ducts were obtained, and three-dimensional MRCP images were rendered by post processing.  CONTRAST:  20mL MULTIHANCE GADOBENATE DIMEGLUMINE 529 MG/ML IV SOLN  COMPARISON:  CT 12/21/2014   FINDINGS: Exam is suboptimal. Often patients with pancreatitis have difficulty breath holding for exam. Additionally this patient in had difficulty following breathing instructions.  Lower chest:  Lung bases are clear.  Hepatobiliary: No biliary duct dilatation. No focal hepatic lesion. No hepatic steatosis. Gallbladder is normal. There are no gallstones within the lumen of the gallbladder. The common bile duct is difficult to follow but does not appear dilated.  Pancreas: There is extensive inflammation surrounding the pancreas head body and tail. Fluid along the anterior perirenal fascia. No organized fluid collections are identified. 1 small focus high intensity fluid measuring 12 mm x 10 mm ventral to the pancreatic head on image 53 of series 1400 suggests a small proteinaceous fluid collection. Pancreas enhances uniformly on the post-contrast exam. There is no obvious variant pancreatic ductal anatomy.  Spleen: Normal spleen. Splenic vein is patent. Portal veins are patent.  Adrenals/urinary tract: Adrenal glands and kidneys are normal.  Stomach/Bowel: Stomach and limited of the small bowel is unremarkable  Vascular/Lymphatic: Abdominal aortic normal caliber. No retroperitoneal periportal lymphadenopathy.  Musculoskeletal: No aggressive osseous lesion  IMPRESSION: 1. No cholelithiasis.  Common bile duct appears normal caliber. 2. No clear variant pancreatic ductal anatomy. 3. Exam is somewhat suboptimal due to poor breath holding. 4. Findings consist with acute pancreatitis with pancreatic inflammation and retroperitoneal fluid. 5. No evidence of pancreatic necrosis.   Electronically Signed   By: Roseanne Reno  Amil Amen M.D.   On: 12/24/2014 20:03   Mr Abd W/wo Cm/mrcp  12/24/2014   CLINICAL DATA:  Acute pancreatitis unclear etiology.  EXAM: MRI ABDOMEN WITHOUT AND WITH CONTRAST (INCLUDING MRCP)  TECHNIQUE: Multiplanar multisequence MR imaging of the abdomen was performed both before and after the administration of  intravenous contrast. Heavily T2-weighted images of the biliary and pancreatic ducts were obtained, and three-dimensional MRCP images were rendered by post processing.  CONTRAST:  20mL MULTIHANCE GADOBENATE DIMEGLUMINE 529 MG/ML IV SOLN  COMPARISON:  CT 12/21/2014  FINDINGS: Exam is suboptimal. Often patients with pancreatitis have difficulty breath holding for exam. Additionally this patient in had difficulty following breathing instructions.  Lower chest:  Lung bases are clear.  Hepatobiliary: No biliary duct dilatation. No focal hepatic lesion. No hepatic steatosis. Gallbladder is normal. There are no gallstones within the lumen of the gallbladder. The common bile duct is difficult to follow but does not appear dilated.  Pancreas: There is extensive inflammation surrounding the pancreas head body and tail. Fluid along the anterior perirenal fascia. No organized fluid collections are identified. 1 small focus high intensity fluid measuring 12 mm x 10 mm ventral to the pancreatic head on image 53 of series 1400 suggests a small proteinaceous fluid collection. Pancreas enhances uniformly on the post-contrast exam. There is no obvious variant pancreatic ductal anatomy.  Spleen: Normal spleen. Splenic vein is patent. Portal veins are patent.  Adrenals/urinary tract: Adrenal glands and kidneys are normal.  Stomach/Bowel: Stomach and limited of the small bowel is unremarkable  Vascular/Lymphatic: Abdominal aortic normal caliber. No retroperitoneal periportal lymphadenopathy.  Musculoskeletal: No aggressive osseous lesion  IMPRESSION: 1. No cholelithiasis.  Common bile duct appears normal caliber. 2. No clear variant pancreatic ductal anatomy. 3. Exam is somewhat suboptimal due to poor breath holding. 4. Findings consist with acute pancreatitis with pancreatic inflammation and retroperitoneal fluid. 5. No evidence of pancreatic necrosis.   Electronically Signed   By: Genevive Bi M.D.   On: 12/24/2014 20:03     ASSESSMENT/PLAN:  19 yo male with pancreatitis--etiology unclear. Tol clear liquids. Parents voice frustration about "lack of progress" and concerns over pts nutritional status. Continue clears. Recheck labs in am. Will review with attending re: further plans.     LOS: 8 days   Hvozdovic, Moise Boring 12/25/2014, Pager [phone]     Attending physician's note   I have taken an interval history, reviewed the chart and examined the patient. I agree with the Advanced Practitioner's note, impression and recommendations. MRI/MRCP report and images reviewed showing acute pancreatitis with extensive inflammation throughout pancreas, no necrosis, small fluid collection ventral to pancreatic head, retroperitoneal fluid, no biliary pathology, no clear variant panceatic ductal anatomy. T=100.6. Reviewed current information with pt and pts grandfather at bedside. Consider TNA-will review with [facility]. Continue current mgmt with bowel rest. Monitor labs.  Venita Lick. Russella Dar, MD [facility] [phone] pager Mon-Fri 8a-5p [phone] weekends, holidays and 5p-8a or per [facility]

## 2014-12-26 LAB — COMPREHENSIVE METABOLIC PANEL
ALK PHOS: 61 U/L (ref 38–126)
ALT: 30 U/L (ref 17–63)
ANION GAP: 8 (ref 5–15)
AST: 23 U/L (ref 15–41)
Albumin: 3.1 g/dL — ABNORMAL LOW (ref 3.5–5.0)
BUN: <5 mg/dL — ABNORMAL LOW (ref 6–20)
CO2: 26 mmol/L (ref 22–32)
Calcium: 8.8 mg/dL — ABNORMAL LOW (ref 8.9–10.3)
Chloride: 105 mmol/L (ref 101–111)
Creatinine, Ser: 0.84 mg/dL (ref 0.61–1.24)
Glucose, Bld: 106 mg/dL — ABNORMAL HIGH (ref 65–99)
POTASSIUM: 3.8 mmol/L (ref 3.5–5.1)
SODIUM: 139 mmol/L (ref 135–145)
Total Bilirubin: 1 mg/dL (ref 0.3–1.2)
Total Protein: 6.9 g/dL (ref 6.5–8.1)

## 2014-12-26 LAB — LIPASE, BLOOD: LIPASE: 918 U/L — AB (ref 22–51)

## 2014-12-26 NOTE — Progress Notes (Signed)
Patient ID: Daryl Foster, male   DOB: February 10, 1996, 19 y.o.   MRN: [id number] [facility] Surgery Progress Note:   * No surgery found *  Subjective: Mental status is good for this autistic man.  He complains of no abdominal pain and feels better Objective: Vital signs in last 24 hours: Temp:  [99.2 F (37.3 C)-100.2 F (37.9 C)] 99.2 F (37.3 C) (07/31 0643) Pulse Rate:  [77-91] 90 (07/31 0643) Resp:  [16] 16 (07/31 0643) BP: (111-130)/(44-65) 130/56 mmHg (07/31 0643) SpO2:  [98 %-100 %] 100 % (07/31 0643)  Intake/Output from previous day: 07/30 0701 - 07/31 0700 In: 2600 [P.O.:600; I.V.:2000] Out: -  Intake/Output this shift:    Physical Exam: Work of breathing is normal.  No appreciable abdominal pain   Lab Results:  Results for orders placed or performed during the hospital encounter of 12/17/14 (from the past 48 hour(s))  CBC     Status: Abnormal   Collection Time: 12/25/14  5:45 AM  Result Value Ref Range   WBC 10.3 4.0 - 10.5 K/uL   RBC 4.74 4.22 - 5.81 MIL/uL   Hemoglobin 12.3 (L) 13.0 - 17.0 g/dL   HCT 38.6 (L) 39.0 - 52.0 %   MCV 81.4 78.0 - 100.0 fL   MCH 25.9 (L) 26.0 - 34.0 pg   MCHC 31.9 30.0 - 36.0 g/dL   RDW 12.2 11.5 - 15.5 %   Platelets 291 150 - 400 K/uL  Comprehensive metabolic panel     Status: Abnormal   Collection Time: 12/25/14  5:45 AM  Result Value Ref Range   Sodium 137 135 - 145 mmol/L   Potassium 3.5 3.5 - 5.1 mmol/L   Chloride 102 101 - 111 mmol/L   CO2 24 22 - 32 mmol/L   Glucose, Bld 127 (H) 65 - 99 mg/dL   BUN 6 6 - 20 mg/dL   Creatinine, Ser 0.80 0.61 - 1.24 mg/dL   Calcium 9.0 8.9 - 10.3 mg/dL   Total Protein 7.2 6.5 - 8.1 g/dL   Albumin 3.3 (L) 3.5 - 5.0 g/dL   AST 25 15 - 41 U/L   ALT 34 17 - 63 U/L   Alkaline Phosphatase 64 38 - 126 U/L   Total Bilirubin 1.5 (H) 0.3 - 1.2 mg/dL   GFR calc non Af Amer >60 >60 mL/min   GFR calc Af Amer >60 >60 mL/min    Comment: (NOTE) The eGFR has been calculated using the CKD EPI  equation. This calculation has not been validated in all clinical situations. eGFR's persistently <60 mL/min signify possible Chronic Kidney Disease.    Anion gap 11 5 - 15  Lipase, blood     Status: Abnormal   Collection Time: 12/25/14  5:45 AM  Result Value Ref Range   Lipase 426 (H) 22 - 51 U/L    Comment: RESULTS CONFIRMED BY MANUAL DILUTION REPEATED TO VERIFY   Comprehensive metabolic panel     Status: Abnormal   Collection Time: 12/26/14  4:53 AM  Result Value Ref Range   Sodium 139 135 - 145 mmol/L   Potassium 3.8 3.5 - 5.1 mmol/L   Chloride 105 101 - 111 mmol/L   CO2 26 22 - 32 mmol/L   Glucose, Bld 106 (H) 65 - 99 mg/dL   BUN <5 (L) 6 - 20 mg/dL   Creatinine, Ser 0.84 0.61 - 1.24 mg/dL   Calcium 8.8 (L) 8.9 - 10.3 mg/dL   Total Protein 6.9 6.5 -  8.1 g/dL   Albumin 3.1 (L) 3.5 - 5.0 g/dL   AST 23 15 - 41 U/L   ALT 30 17 - 63 U/L   Alkaline Phosphatase 61 38 - 126 U/L   Total Bilirubin 1.0 0.3 - 1.2 mg/dL   GFR calc non Af Amer >60 >60 mL/min   GFR calc Af Amer >60 >60 mL/min    Comment: (NOTE) The eGFR has been calculated using the CKD EPI equation. This calculation has not been validated in all clinical situations. eGFR's persistently <60 mL/min signify possible Chronic Kidney Disease.    Anion gap 8 5 - 15  Lipase, blood     Status: Abnormal   Collection Time: 12/26/14  4:53 AM  Result Value Ref Range   Lipase 918 (H) 22 - 51 U/L    Comment: RESULTS CONFIRMED BY MANUAL DILUTION    Radiology/Results: Mr 3d Recon At Scanner  12/24/2014   CLINICAL DATA:  Acute pancreatitis unclear etiology.  EXAM: MRI ABDOMEN WITHOUT AND WITH CONTRAST (INCLUDING MRCP)  TECHNIQUE: Multiplanar multisequence MR imaging of the abdomen was performed both before and after the administration of intravenous contrast. Heavily T2-weighted images of the biliary and pancreatic ducts were obtained, and three-dimensional MRCP images were rendered by post processing.  CONTRAST:  58mL  MULTIHANCE GADOBENATE DIMEGLUMINE 529 MG/ML IV SOLN  COMPARISON:  CT 12/21/2014  FINDINGS: Exam is suboptimal. Often patients with pancreatitis have difficulty breath holding for exam. Additionally this patient in had difficulty following breathing instructions.  Lower chest:  Lung bases are clear.  Hepatobiliary: No biliary duct dilatation. No focal hepatic lesion. No hepatic steatosis. Gallbladder is normal. There are no gallstones within the lumen of the gallbladder. The common bile duct is difficult to follow but does not appear dilated.  Pancreas: There is extensive inflammation surrounding the pancreas head body and tail. Fluid along the anterior perirenal fascia. No organized fluid collections are identified. 1 small focus high intensity fluid measuring 12 mm x 10 mm ventral to the pancreatic head on image 53 of series 1400 suggests a small proteinaceous fluid collection. Pancreas enhances uniformly on the post-contrast exam. There is no obvious variant pancreatic ductal anatomy.  Spleen: Normal spleen. Splenic vein is patent. Portal veins are patent.  Adrenals/urinary tract: Adrenal glands and kidneys are normal.  Stomach/Bowel: Stomach and limited of the small bowel is unremarkable  Vascular/Lymphatic: Abdominal aortic normal caliber. No retroperitoneal periportal lymphadenopathy.  Musculoskeletal: No aggressive osseous lesion  IMPRESSION: 1. No cholelithiasis.  Common bile duct appears normal caliber. 2. No clear variant pancreatic ductal anatomy. 3. Exam is somewhat suboptimal due to poor breath holding. 4. Findings consist with acute pancreatitis with pancreatic inflammation and retroperitoneal fluid. 5. No evidence of pancreatic necrosis.   Electronically Signed   By: Suzy Bouchard M.D.   On: 12/24/2014 20:03   Mr Abd W/wo Cm/mrcp  12/24/2014   CLINICAL DATA:  Acute pancreatitis unclear etiology.  EXAM: MRI ABDOMEN WITHOUT AND WITH CONTRAST (INCLUDING MRCP)  TECHNIQUE: Multiplanar multisequence MR  imaging of the abdomen was performed both before and after the administration of intravenous contrast. Heavily T2-weighted images of the biliary and pancreatic ducts were obtained, and three-dimensional MRCP images were rendered by post processing.  CONTRAST:  64mL MULTIHANCE GADOBENATE DIMEGLUMINE 529 MG/ML IV SOLN  COMPARISON:  CT 12/21/2014  FINDINGS: Exam is suboptimal. Often patients with pancreatitis have difficulty breath holding for exam. Additionally this patient in had difficulty following breathing instructions.  Lower chest:  Lung bases are  clear.  Hepatobiliary: No biliary duct dilatation. No focal hepatic lesion. No hepatic steatosis. Gallbladder is normal. There are no gallstones within the lumen of the gallbladder. The common bile duct is difficult to follow but does not appear dilated.  Pancreas: There is extensive inflammation surrounding the pancreas head body and tail. Fluid along the anterior perirenal fascia. No organized fluid collections are identified. 1 small focus high intensity fluid measuring 12 mm x 10 mm ventral to the pancreatic head on image 53 of series 1400 suggests a small proteinaceous fluid collection. Pancreas enhances uniformly on the post-contrast exam. There is no obvious variant pancreatic ductal anatomy.  Spleen: Normal spleen. Splenic vein is patent. Portal veins are patent.  Adrenals/urinary tract: Adrenal glands and kidneys are normal.  Stomach/Bowel: Stomach and limited of the small bowel is unremarkable  Vascular/Lymphatic: Abdominal aortic normal caliber. No retroperitoneal periportal lymphadenopathy.  Musculoskeletal: No aggressive osseous lesion  IMPRESSION: 1. No cholelithiasis.  Common bile duct appears normal caliber. 2. No clear variant pancreatic ductal anatomy. 3. Exam is somewhat suboptimal due to poor breath holding. 4. Findings consist with acute pancreatitis with pancreatic inflammation and retroperitoneal fluid. 5. No evidence of pancreatic necrosis.    Electronically Signed   By: Suzy Bouchard M.D.   On: 12/24/2014 20:03    Anti-infectives: Anti-infectives    Start     Dose/Rate Route Frequency Ordered Stop   12/17/14 1415  cefOXitin (MEFOXIN) 1 g in dextrose 5 % 50 mL IVPB     1 g 100 mL/hr over 30 Minutes Intravenous  Once 12/17/14 1405 12/17/14 1538      Assessment/Plan: Problem List: Patient Active Problem List   Diagnosis Date Noted  . Pancreatitis 12/17/2014    Lipase today is up over 900 from 200.  His mother wants to try him on some foods of her choosing.  I explained the rationale for NPO but she wants to try some toast and solids.  Regular diet ordered so that she can choose foods for her son.  Will check lipase in the am.   * No surgery found *    LOS: 9 days   Matt B. Hassell Done, MD, [facility] [phone] beeper [phone]  12/26/2014 9:23 AM

## 2014-12-26 NOTE — Progress Notes (Signed)
     [facility] Progress Note  Subjective:   Temp 100.2 last pm. Lipase today  918. Feels well. No N/V. Mom voices ongoing concerns about nutritional status. She is very hesitant to consider TNA--has read a lot and is worried about complications. Pt says he is homesick and wants to get home. Mom and pt ask about trial of advancing diet a little as pt says he feels better.   Objective:  Vital signs in last 24 hours: Temp:  [99.2 F (37.3 C)-100.2 F (37.9 C)] 99.2 F (37.3 C) (07/31 0643) Pulse Rate:  [77-91] 90 (07/31 0643) Resp:  [16] 16 (07/31 0643) BP: (111-130)/(44-65) 130/56 mmHg (07/31 0643) SpO2:  [98 %-100 %] 100 % (07/31 0643) Last BM Date: 12/24/14 General:   Alert,  Well-developed,    in NAD Heart:  Regular rate and rhythm; no murmurs Pulm;lungs clear Abdomen:  Soft, nontender and nondistended. Normal bowel sounds, without guarding, and without rebound.   Extremities:  Without edema. Neurologic: Alert and  oriented x4;  grossly normal neurologically. Psych:  Alert and cooperative. Normal mood and affect.  Intake/Output from previous day: 07/30 0701 - 07/31 0700 In: 2600 [P.O.:600; I.V.:2000] Out: -  Intake/Output this shift:    Lab Results:  Recent Labs  12/24/14 0505 12/25/14 0545  WBC 10.3 10.3  HGB 13.4 12.3*  HCT 41.4 38.6*  PLT 260 291   BMET  Recent Labs  12/24/14 0505 12/25/14 0545 12/26/14 0453  NA 137 137 139  K 3.8 3.5 3.8  CL 103 102 105  CO2 GLUCOSE 107* 127* 106*  BUN 7 6 <5*  CREATININE 0.84 0.80 0.84  CALCIUM 9.2 9.0 8.8*   LFT  Recent Labs  12/26/14 0453  PROT 6.9  ALBUMIN 3.1*  AST 23  ALT 30  ALKPHOS 61  BILITOT 1.0      ASSESSMENT/PLAN:  19 yo male with pancreatitis--etiology unclear. Tol clear liquids. MRI/MRCP report and images reviewed showing acute pancreatitis with extensive inflammation throughout pancreas, no necrosis, small fluid collection ventral to pancreatic head,  retroperitoneal fluid, no biliary pathology, no clear variant panceatic ductal anatomy. Lipase 918. Continues to have low grade temp. Clinically feels well. Continue current mgt, trend labs. Consider TNA vs slow advance in diet--will review with [facility].     LOS: 9 days   Hvozdovic, Moise Boring 12/26/2014, Pager [phone]     Attending physician's note   I have taken an interval history, reviewed the chart and examined the patient. I agree with the Advanced Practitioner's note, impression and recommendations. Pt feeling better-denies abd pain, nausea. Mild epigastric tenderness on my exam. Lipase has increased to 918 today and low grade temp again last night. Pts grandfather in room today with patient. Notified that Dr. Christella Hartigan will be our hospital MD starting on Monday. Pts mother spoke with  Dr. Daphine Deutscher earlier today and wanted her son to have a regular diet. Dr. Daphine Deutscher explained rationale for NPO/clear liquids and agreed to advanced pts diet per family request. We will see how he tolerates diet. Trend lipase.   Venita Lick. Russella Dar, MD [facility] [phone] pager Mon-Fri 8a-5p [phone] weekends, holidays and 5p-8a or per [facility]

## 2014-12-27 LAB — COMPREHENSIVE METABOLIC PANEL
ALT: 27 U/L (ref 17–63)
ANION GAP: 8 (ref 5–15)
AST: 23 U/L (ref 15–41)
Albumin: 3.2 g/dL — ABNORMAL LOW (ref 3.5–5.0)
Alkaline Phosphatase: 62 U/L (ref 38–126)
BUN: <5 mg/dL — ABNORMAL LOW (ref 6–20)
CHLORIDE: 105 mmol/L (ref 101–111)
CO2: 26 mmol/L (ref 22–32)
Calcium: 9.1 mg/dL (ref 8.9–10.3)
Creatinine, Ser: 0.79 mg/dL (ref 0.61–1.24)
Glucose, Bld: 105 mg/dL — ABNORMAL HIGH (ref 65–99)
Potassium: 3.8 mmol/L (ref 3.5–5.1)
Sodium: 139 mmol/L (ref 135–145)
TOTAL PROTEIN: 7 g/dL (ref 6.5–8.1)
Total Bilirubin: 0.4 mg/dL (ref 0.3–1.2)

## 2014-12-27 LAB — CBC WITH DIFFERENTIAL/PLATELET
Basophils Absolute: 0 10*3/uL (ref 0.0–0.1)
Basophils Relative: 1 % (ref 0–1)
EOS ABS: 0.6 10*3/uL (ref 0.0–0.7)
EOS PCT: 8 % — AB (ref 0–5)
HCT: 37.7 % — ABNORMAL LOW (ref 39.0–52.0)
HEMOGLOBIN: 11.8 g/dL — AB (ref 13.0–17.0)
Lymphocytes Relative: 19 % (ref 12–46)
Lymphs Abs: 1.3 10*3/uL (ref 0.7–4.0)
MCH: 25.8 pg — AB (ref 26.0–34.0)
MCHC: 31.3 g/dL (ref 30.0–36.0)
MCV: 82.5 fL (ref 78.0–100.0)
Monocytes Absolute: 0.9 10*3/uL (ref 0.1–1.0)
Monocytes Relative: 13 % — ABNORMAL HIGH (ref 3–12)
Neutro Abs: 4.2 10*3/uL (ref 1.7–7.7)
Neutrophils Relative %: 59 % (ref 43–77)
Platelets: 289 10*3/uL (ref 150–400)
RBC: 4.57 MIL/uL (ref 4.22–5.81)
RDW: 12.3 % (ref 11.5–15.5)
WBC: 7 10*3/uL (ref 4.0–10.5)

## 2014-12-27 LAB — LIPASE, BLOOD: LIPASE: 581 U/L — AB (ref 22–51)

## 2014-12-27 NOTE — Progress Notes (Signed)
  Subjective: Still has a small amount of mid epigastric discomfort with palpation, no nausea or vomiting.  He walked a good deal yesterday.  Tolerating what his mom gave him well.  Objective: Vital signs in last 24 hours: Temp:  [98.8 F (37.1 C)-100.7 F (38.2 C)] 99.2 F (37.3 C) (08/01 0620) Pulse Rate:  [88-98] 92 (08/01 0620) Resp:  [16-18] 18 (08/01 0620) BP: (113-133)/(61-80) 130/67 mmHg (08/01 0620) SpO2:  [97 %-100 %] 100 % (08/01 0620) Last BM Date: 12/25/14 840 PO Voided x 6 Single PM temp 100.7 Lipase down to 581, WBC 7000 Intake/Output from previous day: 07/31 0701 - 08/01 0700 In: 3388.3 [P.O.:840; I.V.:2548.3] Out: -  Intake/Output this shift:    General appearance: alert, cooperative and no distress GI: soft, still tender to palpation mid epigastric area.    Lab Results:   Recent Labs  12/25/14 0545 12/27/14 0453  WBC 10.3 7.0  HGB 12.3* 11.8*  HCT 38.6* 37.7*  PLT 291 289    BMET  Recent Labs  12/26/14 0453 12/27/14 0453  NA 139 139  K 3.8 3.8  CL 105 105  CO2 26 26  GLUCOSE 106* 105*  BUN <5* <5*  CREATININE 0.84 0.79  CALCIUM 8.8* 9.1   PT/INR No results for input(s): LABPROT, INR in the last 72 hours.   Recent Labs Lab 12/22/14 0527 12/24/14 0505 12/25/14 0545 12/26/14 0453 12/27/14 0453  AST ALT 25 40 34 30 27  ALKPHOS 67 70 64 61 62  BILITOT 1.0 1.9* 1.5* 1.0 0.4  PROT 7.2 7.3 7.2 6.9 7.0  ALBUMIN 3.4* 3.4* 3.3* 3.1* 3.2*     Lipase     Component Value Date/Time   LIPASE 581* 12/27/2014 0453     Studies/Results: No results found.  Medications: . enoxaparin (LOVENOX) injection  0.5 mg/kg Subcutaneous Q24H  . pantoprazole  40 mg Oral BID  . sodium chloride  750 mL Intravenous Once    Assessment/Plan Acute pancreatitis of unknown etiology Possible abnormal appendix Autism BMI 33 Antibiotics: None DVT: Lovenox/SCD    Plan:  I will leave him on current diet, and recheck labs again  tomorrow.  Will discus with GI and Dr. Andrey Campanile.     LOS: 10 days    JENNINGS,WILLARD 12/27/2014  I have seen and examined this patient and agree with the assessment and plan.   Matt B. Daphine Deutscher, MD, [facility] [phone] beeper [phone]  12/27/2014 2:12 PM

## 2014-12-27 NOTE — Progress Notes (Signed)
Patient ID: Daryl Foster, male   DOB: 20-Jan-1996, 19 y.o.   MRN: [id number]    Progress Note   Subjective  Feels a lot better- had low fat diet yesterday and Mom says he had more energy and did laps around the hall. He denies any abdominal pain this am- not taking any pain meds, no nausea   Objective   Vital signs in last 24 hours: Temp:  [98.8 F (37.1 C)-100.7 F (38.2 C)] 99.2 F (37.3 C) (08/01 0620) Pulse Rate:  [88-98] 92 (08/01 0620) Resp:  [16-18] 18 (08/01 0620) BP: (113-133)/(61-80) 130/67 mmHg (08/01 0620) SpO2:  [97 %-100 %] 100 % (08/01 0620) Last BM Date: 12/25/14 General:    White male in NAD Heart:  Regular rate and rhythm; no murmurs Lungs: Respirations even and unlabored, lungs CTA bilaterally Abdomen:  Soft, nontender and nondistended. Normal bowel sounds. Extremities:  Without edema. Neurologic:  Alert and oriented,  grossly normal neurologically. Psych:  Cooperative. Normal mood and affect.  Lab Results:  Recent Labs  12/25/14 0545 12/27/14 0453  WBC 10.3 7.0  HGB 12.3* 11.8*  HCT 38.6* 37.7*  PLT 291 289   BMET  Recent Labs  12/25/14 0545 12/26/14 0453 12/27/14 0453  NA 137 139 139  K 3.5 3.8 3.8  CL 102 105 105  CO2 GLUCOSE 127* 106* 105*  BUN 6 <5* <5*  CREATININE 0.80 0.84 0.79  CALCIUM 9.0 8.8* 9.1   LFT  Recent Labs  12/27/14 0453  PROT 7.0  ALBUMIN 3.2*  AST 23  ALT 27  ALKPHOS 62  BILITOT 0.4      Assessment / Plan:    #1 19 yo male with acute pancreatitis of unknown etiology-no complicating features.  day #10 hospital stay  Now tolerating low fat diet- leave on bland low fat diet -pt voices understanding  Will check autoimmune markers if not done  Hopefully home in next day or two if continues improvement  Active Problems:   Pancreatitis     LOS: 10 days   Amy Esterwood  12/27/2014, 8:21 AM    ________________________________________________________________________  [facility] MD note:  I  personally examined the patient, reviewed the data and agree with the assessment and plan described above.  He looks well without any abd pain, no n/v after eating cereal for BF.  Walking in halls. "dancing" per his mother.  Only very mildly tender on exam today.  Idiopathic pancreatitis, will check autoimmune markers.  I suspect we will not find clear cause of his pancreatitis. Viral etiology is possible of course.  Should continue to advance diet, low fat menu.  I have cancelled future lipase and CMET levels.  OK to go home tomorrow if doing as well as he is today.   Rob Bunting, MD [facility] Pager [phone]

## 2014-12-28 LAB — CBC
HEMATOCRIT: 38.1 % — AB (ref 39.0–52.0)
Hemoglobin: 11.9 g/dL — ABNORMAL LOW (ref 13.0–17.0)
MCH: 25.4 pg — AB (ref 26.0–34.0)
MCHC: 31.2 g/dL (ref 30.0–36.0)
MCV: 81.2 fL (ref 78.0–100.0)
PLATELETS: 279 10*3/uL (ref 150–400)
RBC: 4.69 MIL/uL (ref 4.22–5.81)
RDW: 12.1 % (ref 11.5–15.5)
WBC: 7.5 10*3/uL (ref 4.0–10.5)

## 2014-12-28 LAB — ANTINUCLEAR ANTIBODIES, IFA: ANA Ab, IFA: NEGATIVE

## 2014-12-28 LAB — IGG 4: IgG, Subclass 4: 45 mg/dL (ref 1–291)

## 2014-12-28 MED ORDER — ACETAMINOPHEN 325 MG PO TABS: 650.0000 mg | ORAL_TABLET | Freq: Four times a day (QID) | ORAL | Status: DC | PRN

## 2014-12-28 NOTE — Progress Notes (Signed)
Patient is alert and oriented, vital signs are stable, patient tolerating his diet without complaints of nausea or vomiting, patient with no complaints of pain or discomfort, discharge instructions reviewed with patient and patient's mother and father, patient instructed to follow up with GI and MD, questions and concerns answered Stanford Breed RN 11:21 AM 12-28-2014

## 2014-12-28 NOTE — Progress Notes (Signed)
  Subjective: He denies any pain, No pain meds including Tylenol for 2 days. He is taking a very bland diet without issue.  Objective: Vital signs in last 24 hours: Temp:  [99.1 F (37.3 C)-99.5 F (37.5 C)] 99.1 F (37.3 C) (08/02 0535) Pulse Rate:  [85-98] 88 (08/02 0535) Resp:  [16-18] 18 (08/02 0535) BP: (119-141)/(55-79) 119/55 mmHg (08/02 0535) SpO2:  [99 %-100 %] 100 % (08/02 0535) Last BM Date: 12/25/14 240 PO Tm 99.5 WBC is normal Dr. Christella Hartigan canceled chemistries Intake/Output from previous day: 08/01 0701 - 08/02 0700 In: 1829.2 [P.O.:240; I.V.:1589.2] Out: 0  Intake/Output this shift:    General appearance: alert, cooperative and no distress GI: soft, non-tender; bowel sounds normal; no masses,  no organomegaly  Lab Results:   Recent Labs  12/27/14 0453 12/28/14 0500  WBC 7.0 7.5  HGB 11.8* 11.9*  HCT 37.7* 38.1*  PLT 289 279    BMET  Recent Labs  12/26/14 0453 12/27/14 0453  NA 139 139  K 3.8 3.8  CL 105 105  CO2 26 26  GLUCOSE 106* 105*  BUN <5* <5*  CREATININE 0.84 0.79  CALCIUM 8.8* 9.1   PT/INR No results for input(s): LABPROT, INR in the last 72 hours.   Recent Labs Lab 12/22/14 0527 12/24/14 0505 12/25/14 0545 12/26/14 0453 12/27/14 0453  AST ALT 25 40 34 30 27  ALKPHOS 67 70 64 61 62  BILITOT 1.0 1.9* 1.5* 1.0 0.4  PROT 7.2 7.3 7.2 6.9 7.0  ALBUMIN 3.4* 3.4* 3.3* 3.1* 3.2*     Lipase     Component Value Date/Time   LIPASE 581* 12/27/2014 0453     Studies/Results: No results found.  Medications: . enoxaparin (LOVENOX) injection  0.5 mg/kg Subcutaneous Q24H  . pantoprazole  40 mg Oral BID  . sodium chloride  750 mL Intravenous Once    Assessment/Plan Acute pancreatitis of unknown etiology Possible abnormal appendix Autism BMI 33 Antibiotics: None DVT: Lovenox/SCD   Plan:  I will check on d/c.  Dr. Russella Dar placed him on PPI, I will check and see if he needs that.  IG g4 is 45.  When  discharged I will have him follow up with Dr. Russella Dar and Dr. Clelia Croft.      LOS: 11 days    Daryl Foster 12/28/2014

## 2014-12-28 NOTE — Discharge Summary (Signed)
Physician Discharge Summary  Patient ID: Daryl Foster MRN: [id number] DOB/AGE: [birth year] 21 y.o.  PCP:  Martha Clan, MD GI:  Dr. Karolee Ohs  Admit date: 12/17/2014 Discharge date: 12/28/2014  Admission Diagnoses:  Acute pancreatitis Abnormal Appendix Autisim BMI 33 Discharge Diagnoses:  Acute pancreatitis of unknown etiology Autism BMI 33  Active Problems:   Pancreatitis   PROCEDURES: None  Hospital Course: 19 y/o male with history of autism, here with both parents. He awoke 4 AM with abdominal pain, and 2 episodes of vomiting. He had a low grade temp 99.9. He has not eaten anything since last PM, He lives at home with family and does not drink or smoke. He is well cared for by his family. Family brought him to the ED this AM about 9 AM. He is afebrile in the ED, HR up some, but VSS. WBC up to 16.2, left shift, lipase is also elevated and 383, glucose is 156. CT scan shows: Inflammatory changes are noted around the pancreatic body and tail suggesting acute pancreatitis. The appendix is mildly enlarged at approximately 9 mm with mild surrounding inflammation concerning for appendicitis in the appropriate clinical setting. There is no evidence of bowel obstruction. No abnormal fluid collection is noted. Urinary bladder appears normal. No significant adenopathy is noted. We are ask to see.  We did not believe his appendix is involved.  Most likely cause was cholelithiasis. He was admitted to our service because his father did not want Hospitalist service involved.  Work up including ultrasound on 12/17/14 showed a normal gallbladder, no cholelithiasis, CBD was 3 mm. Liver: Hepatic increased echogenicity likely indicates underlying steatosis without focal abnormality or intrahepatic ductal dilatation. He did better over the weekend and he was started on clears.  Monday family wanted to up his diet, but he was still tender and started vomiting later in the day. Diet was adjusted  to ice chips.  He continued to have pain and a repeat CT scan was done on 12/21/14, showing: Inflammatory changes are noted around the pancreatic body and tailconsistent with acute pancreatitis. Inflammatory changes are noted in the mesenteric regions, but no defined fluid collection or pseudocyst is seen at this time.  The appendix was normal.  Lipase normalized and then went up.  Max lipase on 12/26/14 -  918. His WBC normalized after admit on 7/27.  He continued to have low grade fevers at night and discomfort.  Family was very upset at his lack of progress.    GI was consulted on 7/29, and we ordered an MRI.  No real cause for pancreatitis was ever established.  MRI on 7/29: 1. No cholelithiasis. Common bile duct appears normal caliber. 2. No clear variant pancreatic ductal anatomy. 3. Exam is somewhat suboptimal due to poor breath holding. 4. Findings consist with acute pancreatitis with pancreatic inflammation and retroperitoneal fluid. 5. No evidence of pancreatic necrosis.  Family insisted on trying some more solid food, so Dr. Daphine Deutscher allowed family them to pick bland foods.  His lipase is coming down, he is pain free, and tolerating diet family has been giving. Both r. Christella Hartigan and Dr. Andrey Campanile agree with discharge.  He can follow up with Dr. Clelia Croft and Dr. Russella Dar as needed.  CBC Latest Ref Rng 12/28/2014 12/27/2014 12/25/2014  WBC 4.0 - 10.5 K/uL 7.5 7.0 10.3  Hemoglobin 13.0 - 17.0 g/dL 11.9(L) 11.8(L) 12.3(L)  Hematocrit 39.0 - 52.0 % 38.1(L) 37.7(L) 38.6(L)  Platelets 150 - 400 K/uL 279 289 291  CMP Latest Ref Rng 12/27/2014 12/26/2014 12/25/2014  Glucose 65 - 99 mg/dL 409(W) 119(J) 478(G)  BUN 6 - 20 mg/dL <9(F) <6(O) 6  Creatinine 0.61 - 1.24 mg/dL 1.30 8.65 7.84  Sodium 135 - 145 mmol/L 139 139 137  Potassium 3.5 - 5.1 mmol/L 3.8 3.8 3.5  Chloride 101 - 111 mmol/L 105 105 102  CO2 22 - 32 mmol/L 26 26 24   Calcium 8.9 - 10.3 mg/dL 9.1 6.9(G) 9.0  Total Protein 6.5 - 8.1 g/dL 7.0 6.9 7.2  Total  Bilirubin 0.3 - 1.2 mg/dL 0.4 1.0 2.9(B)  Alkaline Phos 38 - 126 U/L 62 61 64  AST 15 - 41 U/L 23 23 25   ALT 17 - 63 U/L 27 30 34   LIPASE:  8/1:  581      7/31:  918      7/30:  426       7/29:  293      7/28:  191        7/27: 208       7/25: 38       7/22 383 Chemistries canceled on 8/1 by Dr. Christella Hartigan.  Condition on d/c:  Improving    Disposition: Final discharge disposition not confirmed     Medication List    TAKE these medications        acetaminophen 325 MG tablet  Commonly known as:  TYLENOL  Take 2 tablets (650 mg total) by mouth every 6 (six) hours as needed for fever.     RA GUMMY VITAMINS & MINERALS PO  Take 1 tablet by mouth daily. Gummy vitamin not a tablet       Follow-up Information    Follow up with Martha Clan, MD. Schedule an appointment as soon as possible for a visit in 2 weeks.   Specialty:  Internal Medicine   Why:  Call for follow up with Dr. Constance Haw information:   [address] [place] [address] [phone]       Schedule an appointment as soon as possible for a visit with Judie Petit T. Russella Dar, MD.   Specialty:  Gastroenterology   Why:  As needed   Contact information:   [address] [place] [address] [phone]       Signed: Sherrie George 12/28/2014, 10:35 AM

## 2014-12-28 NOTE — Progress Notes (Signed)
Patient ID: Daryl Foster, male   DOB: 10/03/1995, 19 y.o.   MRN: [id number]    Progress Note   Subjective  Doing well. No pain, no nausea,eating without difficulty- excited to be going home   Objective   Vital signs in last 24 hours: Temp:  [99.1 F (37.3 C)-99.5 F (37.5 C)] 99.1 F (37.3 C) (08/02 0535) Pulse Rate:  [85-98] 88 (08/02 0535) Resp:  [16-18] 18 (08/02 0535) BP: (119-141)/(55-79) 119/55 mmHg (08/02 0535) SpO2:  [99 %-100 %] 100 % (08/02 0535) Last BM Date: 12/25/14 General:    White male in NAD Heart:  Regular rate and rhythm; no murmurs Lungs: Respirations even and unlabored, lungs CTA bilaterally Abdomen:  Soft, nontender and nondistended. Normal bowel sounds. Extremities:  Without edema. Neurologic:  Alert and oriented,  grossly normal neurologically. Psych:  Cooperative. Normal mood and affect.  Intake/Output from previous day: 08/01 0701 - 08/02 0700 In: 1829.2 [P.O.:240; I.V.:1589.2] Out: 0  Intake/Output this shift:    Lab Results:  IGg4- 45  Recent Labs  12/27/14 0453 12/28/14 0500  WBC 7.0 7.5  HGB 11.8* 11.9*  HCT 37.7* 38.1*  PLT 289 279   BMET  Recent Labs  12/26/14 0453 12/27/14 0453  NA 139 139  K 3.8 3.8  CL 105 105  CO2 26 26  GLUCOSE 106* 105*  BUN <5* <5*  CREATININE 0.84 0.79  CALCIUM 8.8* 9.1   LFT  Recent Labs  12/27/14 0453  PROT 7.0  ALBUMIN 3.2*  AST 23  ALT 27  ALKPHOS 62  BILITOT 0.4   PT/INR No results for input(s): LABPROT, INR in the last 72 hours.    Assessment / Plan:    #1 19 yo male with acute pancreatitis of unknown etiology.. IgG4 wnl, ANA pending-may have been viral, other workup all negative for underlying biliary pathology. He is much improved, tolerating lowfat diet, and not requiring any pain meds Stable for discharge home today on low fat diet He should follow up with Dr Clelia Croft who knows him well, have repeat labs in a week or so, and happy to see him as needed at  Dr Alver Fisher  request.   Active Problems:   Pancreatitis     LOS: 11 days   Amy Esterwood  12/28/2014, 8:35 AM  ________________________________________________________________________  Corinda Gubler GI MD note:  I personally examined the patient, reviewed the data and agree with the assessment and plan described above.  He does not need PPI if he was not on one prior to admit.  OK to d/c home.    Please call or page with any further questions or concerns.    Rob Bunting, MD [facility] Pager [phone]

## 2014-12-28 NOTE — Discharge Instructions (Signed)
Acute Pancreatitis Acute pancreatitis is a disease in which the pancreas becomes suddenly inflamed. The pancreas is a large gland located behind your stomach. The pancreas produces enzymes that help digest food. The pancreas also releases the hormones glucagon and insulin that help regulate blood sugar. Damage to the pancreas occurs when the digestive enzymes from the pancreas are activated and begin attacking the pancreas before being released into the intestine. Most acute attacks last a couple of days and can cause serious complications. Some people become dehydrated and develop low blood pressure. In severe cases, bleeding into the pancreas can lead to shock and can be life-threatening. The lungs, heart, and kidneys may fail. CAUSES  Pancreatitis can happen to anyone. In some cases, the cause is unknown. Most cases are caused by:  Alcohol abuse.  Gallstones.  Some causes are unknown Other less common causes are:  Certain medicines.  Exposure to certain chemicals.  Infection.  Damage caused by an accident (trauma).  Abdominal surgery. SYMPTOMS   Pain in the upper abdomen that may radiate to the back.  Tenderness and swelling of the abdomen.  Nausea and vomiting. DIAGNOSIS  Your caregiver will perform a physical exam. Blood and stool tests may be done to confirm the diagnosis. Imaging tests may also be done, such as X-rays, CT scans, or an ultrasound of the abdomen. TREATMENT  Treatment usually requires a stay in the hospital. Treatment may include:  Pain medicine.  Fluid replacement through an intravenous line (IV).  Placing a tube in the stomach to remove stomach contents and control vomiting.  Not eating for 3 or 4 days. This gives your pancreas a rest, because enzymes are not being produced that can cause further damage.  Antibiotic medicines if your condition is caused by an infection.  Surgery of the pancreas or gallbladder. HOME CARE INSTRUCTIONS   Follow the  diet advised by your caregiver. This may involve avoiding alcohol and decreasing the amount of fat in your diet.  Eat smaller, more frequent meals. This reduces the amount of digestive juices the pancreas produces.  Drink enough fluids to keep your urine clear or pale yellow.  Only take over-the-counter or prescription medicines as directed by your caregiver.  Avoid drinking alcohol if it caused your condition.  Do not smoke.  Get plenty of rest.  Check your blood sugar at home as directed by your caregiver.  Keep all follow-up appointments as directed by your caregiver. SEEK MEDICAL CARE IF:   You do not recover as quickly as expected.  You develop new or worsening symptoms.  You have persistent pain, weakness, or nausea.  You recover and then have another episode of pain. SEEK IMMEDIATE MEDICAL CARE IF:   You are unable to eat or keep fluids down.  Your pain becomes severe.  You have a fever or persistent symptoms for more than 2 to 3 days.  You have a fever and your symptoms suddenly get worse.  Your skin or the white part of your eyes turn yellow (jaundice).  You develop vomiting.  You feel dizzy, or you faint.  Your blood sugar is high (over 300 mg/dL). MAKE SURE YOU:   Understand these instructions.  Will watch your condition.  Will get help right away if you are not doing well or get worse. Document Released: 05/14/2005 Document Revised: 11/13/2011 Document Reviewed: 08/23/2011 [facility] Patient Information 2015 [place], [place]. This information is not intended to replace advice given to you by your health care provider. Make sure you  discuss any questions you have with your health care provider. Low-Fat Diet for Pancreatitis or Gallbladder Conditions A low-fat diet can be helpful if you have pancreatitis or a gallbladder condition. With these conditions, your pancreas and gallbladder have trouble digesting fats. A healthy eating plan with less fat will help  rest your pancreas and gallbladder and reduce your symptoms. WHAT DO I NEED TO KNOW ABOUT THIS DIET?  Eat a low-fat diet.  Reduce your fat intake to less than 20-30% of your total daily calories. This is less than 50-60 g of fat per day.  Remember that you need some fat in your diet. Ask your dietician what your daily goal should be.  Choose nonfat and low-fat healthy foods. Look for the words "nonfat," "low fat," or "fat free."  As a guide, look on the label and choose foods with less than 3 g of fat per serving. Eat only one serving.  Avoid alcohol.  Do not smoke. If you need help quitting, talk with your health care provider.  Eat small frequent meals instead of three large heavy meals. WHAT FOODS CAN I EAT? Grains Include healthy grains and starches such as potatoes, wheat bread, fiber-rich cereal, and brown rice. Choose whole grain options whenever possible. In adults, whole grains should account for 45-65% of your daily calories.  Fruits and Vegetables Eat plenty of fruits and vegetables. Fresh fruits and vegetables add fiber to your diet. Meats and Other Protein Sources Eat lean meat such as chicken and pork. Trim any fat off of meat before cooking it. Eggs, fish, and beans are other sources of protein. In adults, these foods should account for 10-35% of your daily calories. Dairy Choose low-fat milk and dairy options. Dairy includes fat and protein, as well as calcium.  Fats and Oils Limit high-fat foods such as fried foods, sweets, baked goods, sugary drinks.  Other Creamy sauces and condiments, such as mayonnaise, can add extra fat. Think about whether or not you need to use them, or use smaller amounts or low fat options. WHAT FOODS ARE NOT RECOMMENDED?  High fat foods, such as:  [employer].  Ice cream.  [place] toast.  Sweet rolls.  Pizza.  Cheese bread.  Foods covered with batter, butter, creamy sauces, or cheese.  Fried foods.  Sugary drinks and  desserts.  Foods that cause gas or bloating Document Released: 05/19/2013 Document Reviewed: 05/19/2013 [facility] Patient Information 2015 [place], [place]. This information is not intended to replace advice given to you by your health care provider. Make sure you discuss any questions you have with your health care provider.

## 2014-12-30 ENCOUNTER — Other Ambulatory Visit (INDEPENDENT_AMBULATORY_CARE_PROVIDER_SITE_OTHER): Payer: 59

## 2014-12-30 ENCOUNTER — Telehealth: Payer: Self-pay | Admitting: Gastroenterology

## 2014-12-30 DIAGNOSIS — K859 Acute pancreatitis, unspecified: Secondary | ICD-10-CM

## 2014-12-30 LAB — HEPATIC FUNCTION PANEL
ALT: 26 U/L (ref 0–53)
AST: 21 U/L (ref 0–37)
Albumin: 3.8 g/dL (ref 3.5–5.2)
Alkaline Phosphatase: 68 U/L (ref 39–117)
BILIRUBIN TOTAL: 0.4 mg/dL (ref 0.2–1.2)
Bilirubin, Direct: 0.2 mg/dL (ref 0.0–0.3)
Total Protein: 7.2 g/dL (ref 6.0–8.3)

## 2014-12-30 LAB — AMYLASE: AMYLASE: 245 U/L — AB (ref 27–131)

## 2014-12-30 LAB — LIPASE: Lipase: 903 U/L — ABNORMAL HIGH (ref 11.0–59.0)

## 2014-12-30 MED ORDER — HYDROCODONE-ACETAMINOPHEN 5-325 MG PO TABS: 1.0000 | ORAL_TABLET | ORAL | Status: DC

## 2014-12-30 MED ORDER — ONDANSETRON HCL 4 MG PO TABS: 4.0000 mg | ORAL_TABLET | Freq: Three times a day (TID) | ORAL | Status: DC | PRN

## 2014-12-30 NOTE — Telephone Encounter (Signed)
Patient's father reports that the patient has one day history of epigastric pain, he recently was discharged from the hospital with pancreatitis.  He has nausea, but no vomiting, low grade fever.  Is not able to tolerate a diet. Asking for pain medications.  He was sent home with no pain meds.  Discussed wiLawson Fiscalh Lori Hvozdovic, PA patient to come this afternoon for amylase, lipase, WBC, and a hepatic.  Vicodin 5/325 1 po q 8 hours #12 zero refills and zofran 4 mg 1 po q 8 hours #30 zero refills.    Father notified to come pick up rx today and also bring him for labs. He will be seen with Gay Filler, PA tomorrow at 11:15

## 2014-12-30 NOTE — Telephone Encounter (Signed)
Sheri, this is Dr Anselm Jungling pt.  He was the original consult in the hospital.

## 2014-12-31 ENCOUNTER — Encounter: Payer: Self-pay | Admitting: Physician Assistant

## 2014-12-31 ENCOUNTER — Ambulatory Visit: Payer: 59 | Admitting: Physician Assistant

## 2014-12-31 ENCOUNTER — Ambulatory Visit (INDEPENDENT_AMBULATORY_CARE_PROVIDER_SITE_OTHER): Payer: 59 | Admitting: Physician Assistant

## 2014-12-31 VITALS — BP 110/70 | HR 70 | Temp 99.6°F | Ht 69.0 in | Wt 248.0 lb

## 2014-12-31 DIAGNOSIS — K859 Acute pancreatitis, unspecified: Secondary | ICD-10-CM | POA: Diagnosis not present

## 2014-12-31 LAB — WBC: WBC: 12.2 10*3/uL — ABNORMAL HIGH (ref 3.4–10.8)

## 2014-12-31 MED ORDER — HYDROCODONE-ACETAMINOPHEN 5-325 MG PO TABS: 1.0000 | ORAL_TABLET | ORAL | Status: DC

## 2014-12-31 MED ORDER — ONDANSETRON HCL 4 MG PO TABS: 4.0000 mg | ORAL_TABLET | Freq: Two times a day (BID) | ORAL | Status: AC

## 2014-12-31 NOTE — Patient Instructions (Addendum)
We have given you a printed prescription of your pain meds We have sent Zofran to your pharmacy Before your next follow up visit have labs drawn  [facility] FUNCTION PANEL     ( orders are in) Your follow up appointment with Lawson Fiscal is on 01/20/2015 at 2:45pm Low Fat Diet handout given

## 2014-12-31 NOTE — Progress Notes (Signed)
Patient ID: Daryl Foster, male   DOB: 1995/11/19, 19 y.o.   MRN: [id number]     History of Present Illness: Daryl Foster is a 19 year old male with a history of autism who is status post recent admission to [name] from 12/17/2014 through 12/28/2014. He presented to the emergency room on July 22 with abdominal pain and 2 episodes of vomiting. He had a low-grade temperature of 99.9. White blood count was up to 16.2 with a left shift and lipase was elevated at 383. CAT scan showed inflammatory changes noted around the pancreatic body and tail suggesting acute pancreatitis. The appendix was mildly enlarged with mild surrounding inflammation concerning for appendicitis. The patient was admitted for bowel rest and IV hydration he was evaluated by surgery it was felt he did not have appendicitis. He had an ultrasound on July 22 that showed a normal gallbladder, no cholelithiasis and the common bile duct was 3 mm the liver was noted to have increased hepatic echogenicity indicating underlying steatosis without focal abnormality or intrahepatic ductal dilatation. His diet was advanced after 2 days but he started vomiting and he was back down to I's chips. He continued to have pain and repeat CT scan was done on July 26 showing inflammatory changes around the pancreatic body and tail consistent with acute pancreatitis. Inflammatory changes are noted in the mesenteric regions, but no defined fluid collection or pseudocyst is seen. Lipase normalized and then went back up his maximum lipase on July 31 was 918. His white blood count normalized after admission on July 27. He was evaluated by GI on July 29 of an MRCP was ordered no real cause for pancreatitis was established the MRCP noted no cholelithiasis and the common bile duct appeared normal. There is no clear. Of pancreatic ductal abnormality. After a week of being in the hospital the patient's family insisted on trying some more solid food and so surgery  allowed him to have bland food and he was discharged home. Unfortunately he was not given pain meds or antiemetics and he called the office yesterday with increased pain and nausea. A prescription for Zofran was called in which the parents state provided excellent relief for him and he has used 3 Vicodin as yesterday with resolution of his pain he has not had to use Vicodin today but his used 1 Zofran. The patient has multiple issues with food consistencies and prefers to eat only fried foods. He does not eat meats, vegetables, fruits or dairy. His parents state that he typically eats french fries, onion rings, or tater tots for 3 meals a day. Patient's parents state they prefer to be followed for this problem and our office.   Past Medical History  Diagnosis Date  . Autism   . 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 No pertinent past surgical history. Family History  Problem Relation Age of Onset  . Hypertension Mother   . Asthma Father    History  Substance Use Topics  . Smoking status: Never Smoker   . Smokeless tobacco: Never Used  . Alcohol Use: No   Current Outpatient Prescriptions  Medication Sig Dispense Refill  . acetaminophen (TYLENOL) 325 MG tablet Take 2 tablets (650 mg total) by mouth every 6 (six) hours as needed for fever.    Marland Kitchen HYDROcodone-acetaminophen (NORCO/VICODIN) 5-325 MG per tablet Take 1 tablet by mouth 8 days. 30 tablet 0  . ondansetron (ZOFRAN) 4 MG tablet Take 1 tablet (4 mg total)  by mouth 2 (two) times daily. 60 tablet 1  . Pediatric Multivit-Minerals-C (RA GUMMY VITAMINS & MINERALS PO) Take 1 tablet by mouth daily. Gummy vitamin not a tablet     No current facility-administered medications for this visit.   No Known Allergies    Review of Systems: Gen: Denies any fever, chills, sweats, anorexia, fatigue, weakness, malaise, weight loss, and sleep disorder CV: Denies chest pain, angina, palpitations, syncope, orthopnea, PND,  peripheral edema, and claudication. Resp: Denies dyspnea at rest, dyspnea with exercise, cough, sputum, wheezing, coughing up blood, and pleurisy. GI: Denies vomiting blood, jaundice, and fecal incontinence.   Denies dysphagia or odynophagia. GU : Denies urinary burning, blood in urine, urinary frequency, urinary hesitancy, nocturnal urination, and urinary incontinence. MS: Denies joint pain, limitation of movement, and swelling, stiffness, low back pain, extremity pain. Denies muscle weakness, cramps, atrophy.  Derm: Denies rash, itching, dry skin, hives, moles, warts, or unhealing ulcers.  Psych: Denies depression, anxiety, memory loss, suicidal ideation, hallucinations, paranoia, and confusion. Heme: Denies bruising, bleeding, and enlarged lymph nodes. Neuro:  Denies any headaches, dizziness, paresthesia Endo:  Denies any problems with DM, thyroid, adrenal  LAB RESULTS:  Recent Labs  12/30/14 1600  WBC 12.2*   Lipase 12/30/2014 903.  Recent Labs  12/30/14 1600  PROT 7.2  ALBUMIN 3.8  AST 21  ALT 26  ALKPHOS 68  BILITOT 0.4  BILIDIR 0.2     Studies:   US Abdomen Complete  12/17/2014   CLINICAL DATA:  Pancreatitis  EXAM: ULTRASOUND ABDOMEN COMPLETE  COMPARISON:  12/16/2004 CT abdomen/ pelvis  FINDINGS: Gallbladder: No gallstones or wall thickening visualized. No sonographic Murphy sign noted.  Common bile duct: Diameter: 3 mm  Liver: Hepatic increased echogenicity likely indicates underlying steatosis without focal abnormality or intrahepatic ductal dilatation.  IVC: No abnormality visualized.  Pancreas: Obscured by bowel gas  Spleen: Size and appearance within normal limits.  Right Kidney: Length: 11.4 cm. Echogenicity within normal limits. No mass or hydronephrosis visualized.  Left Kidney: Length: 11 cm. Echogenicity within normal limits. No mass or hydronephrosis visualized.  Abdominal aorta: No proximal aortic aneurysm. Mid and distal aorta obscured by bowel gas.  Other  findings: None.  IMPRESSION: Hepatic increased echogenicity likely indicates underlying steatosis without focal abnormality or intrahepatic ductal dilatation. No acute intra-abdominal or pelvic pathology.   Electronically Signed   By: Christiana Pellant M.D.   On: 12/17/2014 16:16   Ct Abdomen Pelvis W Contrast  12/21/2014   CLINICAL DATA:  Pancreatitis.  EXAM: CT ABDOMEN AND PELVIS WITH CONTRAST  TECHNIQUE: Multidetector CT imaging of the abdomen and pelvis was performed using the standard protocol following bolus administration of intravenous contrast.  CONTRAST:  OMNIPAQUE IOHEXOL 300 MG/ML  SOLN  COMPARISON:  None.  FINDINGS: Visualized lung bases appear normal. No significant osseous abnormality is noted.  No gallstones are noted. The liver and spleen appear normal. Inflammatory changes are noted around the pancreatic body and tail consistent with acute pancreatitis. Inflammatory changes are noted in the mesenteric regions, but no defined fluid collection or pseudocyst is seen at this time. Adrenal glands and kidneys appear normal. No hydronephrosis or renal obstruction is noted. No renal or ureteral calculi are noted. The appendix appears normal. There is no evidence of bowel obstruction. Urinary bladder appears normal. No significant adenopathy is noted.  IMPRESSION: Findings consistent with acute pancreatitis. No definite pseudocyst formation seen at this time.   Electronically Signed   By: Lupita Raider, M.D.  On: 12/21/2014 12:22   Ct Abdomen Pelvis W Contrast  12/17/2014   CLINICAL DATA:  Acute right flank pain.  EXAM: CT ABDOMEN AND PELVIS WITH CONTRAST  TECHNIQUE: Multidetector CT imaging of the abdomen and pelvis was performed using the standard protocol following bolus administration of intravenous contrast.  CONTRAST:  OMNIPAQUE IOHEXOL 300 MG/ML  SOLN  COMPARISON:  None.  FINDINGS: Visualized lung bases appear normal. No significant osseous abnormality is noted.  No gallstones are  noted. The liver and spleen appear normal. Inflammatory changes are noted around the pancreatic body and tail suggesting acute pancreatitis. Adrenal glands and kidneys appear normal. No hydronephrosis or renal obstruction is noted. No renal or ureteral calculi are noted. The appendix is mildly enlarged at approximately 9 mm with mild surrounding inflammation concerning for appendicitis in the appropriate clinical setting. There is no evidence of bowel obstruction. No abnormal fluid collection is noted. Urinary bladder appears normal. No significant adenopathy is noted.  IMPRESSION: Inflammatory changes are noted around the pancreatic body and tail concerning for acute pancreatitis. No pseudocyst formation is noted. Correlation with laboratory data is recommended.  The appendix appears to be mildly enlarged with surrounding inflammatory changes suggesting acute appendicitis in the appropriate clinical setting.   Electronically Signed   By: Lupita Raider, M.D.   On: 12/17/2014 13:34   Mr 3d Recon At Scanner  12/24/2014   CLINICAL DATA:  Acute pancreatitis unclear etiology.  EXAM: MRI ABDOMEN WITHOUT AND WITH CONTRAST (INCLUDING MRCP)  TECHNIQUE: Multiplanar multisequence MR imaging of the abdomen was performed both before and after the administration of intravenous contrast. Heavily T2-weighted images of the biliary and pancreatic ducts were obtained, and three-dimensional MRCP images were rendered by post processing.  CONTRAST:  20mL MULTIHANCE GADOBENATE DIMEGLUMINE 529 MG/ML IV SOLN  COMPARISON:  CT 12/21/2014  FINDINGS: Exam is suboptimal. Often patients with pancreatitis have difficulty breath holding for exam. Additionally this patient in had difficulty following breathing instructions.  Lower chest:  Lung bases are clear.  Hepatobiliary: No biliary duct dilatation. No focal hepatic lesion. No hepatic steatosis. Gallbladder is normal. There are no gallstones within the lumen of the gallbladder. The common  bile duct is difficult to follow but does not appear dilated.  Pancreas: There is extensive inflammation surrounding the pancreas head body and tail. Fluid along the anterior perirenal fascia. No organized fluid collections are identified. 1 small focus high intensity fluid measuring 12 mm x 10 mm ventral to the pancreatic head on image 53 of series 1400 suggests a small proteinaceous fluid collection. Pancreas enhances uniformly on the post-contrast exam. There is no obvious variant pancreatic ductal anatomy.  Spleen: Normal spleen. Splenic vein is patent. Portal veins are patent.  Adrenals/urinary tract: Adrenal glands and kidneys are normal.  Stomach/Bowel: Stomach and limited of the small bowel is unremarkable  Vascular/Lymphatic: Abdominal aortic normal caliber. No retroperitoneal periportal lymphadenopathy.  Musculoskeletal: No aggressive osseous lesion  IMPRESSION: 1. No cholelithiasis.  Common bile duct appears normal caliber. 2. No clear variant pancreatic ductal anatomy. 3. Exam is somewhat suboptimal due to poor breath holding. 4. Findings consist with acute pancreatitis with pancreatic inflammation and retroperitoneal fluid. 5. No evidence of pancreatic necrosis.   Electronically Signed   By: Genevive Bi M.D.   On: 12/24/2014 20:03   Mr Abd W/wo Cm/mrcp  12/24/2014   CLINICAL DATA:  Acute pancreatitis unclear etiology.  EXAM: MRI ABDOMEN WITHOUT AND WITH CONTRAST (INCLUDING MRCP)  TECHNIQUE: Multiplanar multisequence MR imaging of  the abdomen was performed both before and after the administration of intravenous contrast. Heavily T2-weighted images of the biliary and pancreatic ducts were obtained, and three-dimensional MRCP images were rendered by post processing.  CONTRAST:  20mL MULTIHANCE GADOBENATE DIMEGLUMINE 529 MG/ML IV SOLN  COMPARISON:  CT 12/21/2014  FINDINGS: Exam is suboptimal. Often patients with pancreatitis have difficulty breath holding for exam. Additionally this patient in had  difficulty following breathing instructions.  Lower chest:  Lung bases are clear.  Hepatobiliary: No biliary duct dilatation. No focal hepatic lesion. No hepatic steatosis. Gallbladder is normal. There are no gallstones within the lumen of the gallbladder. The common bile duct is difficult to follow but does not appear dilated.  Pancreas: There is extensive inflammation surrounding the pancreas head body and tail. Fluid along the anterior perirenal fascia. No organized fluid collections are identified. 1 small focus high intensity fluid measuring 12 mm x 10 mm ventral to the pancreatic head on image 53 of series 1400 suggests a small proteinaceous fluid collection. Pancreas enhances uniformly on the post-contrast exam. There is no obvious variant pancreatic ductal anatomy.  Spleen: Normal spleen. Splenic vein is patent. Portal veins are patent.  Adrenals/urinary tract: Adrenal glands and kidneys are normal.  Stomach/Bowel: Stomach and limited of the small bowel is unremarkable  Vascular/Lymphatic: Abdominal aortic normal caliber. No retroperitoneal periportal lymphadenopathy.  Musculoskeletal: No aggressive osseous lesion  IMPRESSION: 1. No cholelithiasis.  Common bile duct appears normal caliber. 2. No clear variant pancreatic ductal anatomy. 3. Exam is somewhat suboptimal due to poor breath holding. 4. Findings consist with acute pancreatitis with pancreatic inflammation and retroperitoneal fluid. 5. No evidence of pancreatic necrosis.   Electronically Signed   By: Genevive Bi M.D.   On: 12/24/2014 20:03     Physical Exam: General: Pleasant, well developed male in no acute distress Head: Normocephalic and atraumatic Eyes:  sclerae anicteric, conjunctiva pink  Ears: Normal auditory acuity Lungs: Clear throughout to auscultation Heart: Regular rate and rhythm Abdomen: Soft, non distended, non-tender. No masses, no hepatomegaly. Normal bowel sounds Musculoskeletal: Symmetrical with no gross  deformities  Extremities: No edema  Neurological: Alert oriented x 4, grossly nonfocal Psychological:  Alert and cooperative. Normal mood and affect  Assessment and Recommendations: 19 year old male status post recent admission for pancreatitis, idiopathic as no etiology has been determined this time. Patient is to use Zofran 4 mg twice a day around-the-clock. He will use Vicodin 5/25 one every 8 hours only as needed. It was explained at length to the patient and his family that the Vicodin may cause constipation and he will need to use something to counteract the constipation if necessary. At this point he is to adhere to a low-fat diet. He will follow-up in 3 weeks, sooner if needed. A repeat CBC, hepatic function panel, and lipase will be drawn the day prior to his visit.        Deveion Denz, Tollie Pizza PA-C 12/31/2014,

## 2014-12-31 NOTE — Progress Notes (Signed)
Reviewed and agree with management plan.  Malcolm T. Stark, MD FACG 

## 2015-01-20 ENCOUNTER — Ambulatory Visit: Payer: 59 | Admitting: Physician Assistant

## 2015-01-21 ENCOUNTER — Encounter (HOSPITAL_COMMUNITY): Payer: Self-pay | Admitting: Internal Medicine

## 2015-01-21 ENCOUNTER — Other Ambulatory Visit: Payer: Self-pay | Admitting: Internal Medicine

## 2015-01-21 ENCOUNTER — Inpatient Hospital Stay (HOSPITAL_COMMUNITY)
Admission: AD | Admit: 2015-01-21 | Discharge: 2015-01-22 | DRG: 438 | Disposition: A | Discharge status: Home / Self Care | Payer: 59 | Source: Ambulatory Visit | Attending: Internal Medicine | Admitting: Internal Medicine

## 2015-01-21 ENCOUNTER — Ambulatory Visit
Admission: RE | Admit: 2015-01-21 | Discharge: 2015-01-21 | Disposition: A | Discharge status: Home / Self Care | Payer: 59 | Source: Ambulatory Visit | Attending: Family Medicine | Admitting: Family Medicine

## 2015-01-21 ENCOUNTER — Other Ambulatory Visit: Payer: Self-pay | Admitting: Family Medicine

## 2015-01-21 DIAGNOSIS — Z825 Family history of asthma and other chronic lower respiratory diseases: Secondary | ICD-10-CM

## 2015-01-21 DIAGNOSIS — I82890 Acute embolism and thrombosis of other specified veins: Secondary | ICD-10-CM | POA: Diagnosis present

## 2015-01-21 DIAGNOSIS — K863 Pseudocyst of pancreas: Secondary | ICD-10-CM | POA: Diagnosis present

## 2015-01-21 DIAGNOSIS — D735 Infarction of spleen: Secondary | ICD-10-CM | POA: Diagnosis present

## 2015-01-21 DIAGNOSIS — F840 Autistic disorder: Secondary | ICD-10-CM | POA: Diagnosis present

## 2015-01-21 DIAGNOSIS — R1013 Epigastric pain: Secondary | ICD-10-CM | POA: Diagnosis not present

## 2015-01-21 DIAGNOSIS — R110 Nausea: Secondary | ICD-10-CM | POA: Diagnosis present

## 2015-01-21 DIAGNOSIS — K858 Other acute pancreatitis without necrosis or infection: Secondary | ICD-10-CM

## 2015-01-21 DIAGNOSIS — K859 Acute pancreatitis without necrosis or infection, unspecified: Secondary | ICD-10-CM | POA: Diagnosis present

## 2015-01-21 DIAGNOSIS — Z8349 Family history of other endocrine, nutritional and metabolic diseases: Secondary | ICD-10-CM

## 2015-01-21 DIAGNOSIS — K850 Idiopathic acute pancreatitis: Secondary | ICD-10-CM | POA: Diagnosis not present

## 2015-01-21 DIAGNOSIS — Q9261 Marker chromosomes in normal individual: Secondary | ICD-10-CM

## 2015-01-21 DIAGNOSIS — K868 Other specified diseases of pancreas: Secondary | ICD-10-CM | POA: Diagnosis not present

## 2015-01-21 LAB — COMPREHENSIVE METABOLIC PANEL
ALT: 28 U/L (ref 17–63)
AST: 30 U/L (ref 15–41)
Albumin: 3.8 g/dL (ref 3.5–5.0)
Alkaline Phosphatase: 94 U/L (ref 38–126)
Anion gap: 7 (ref 5–15)
BUN: 6 mg/dL (ref 6–20)
CHLORIDE: 105 mmol/L (ref 101–111)
CO2: 27 mmol/L (ref 22–32)
CREATININE: 0.86 mg/dL (ref 0.61–1.24)
Calcium: 9.3 mg/dL (ref 8.9–10.3)
Glucose, Bld: 92 mg/dL (ref 65–99)
POTASSIUM: 4.1 mmol/L (ref 3.5–5.1)
SODIUM: 139 mmol/L (ref 135–145)
Total Bilirubin: 0.6 mg/dL (ref 0.3–1.2)
Total Protein: 7 g/dL (ref 6.5–8.1)

## 2015-01-21 LAB — CBC WITH DIFFERENTIAL/PLATELET
BASOS ABS: 0 10*3/uL (ref 0.0–0.1)
BASOS PCT: 0 % (ref 0–1)
Eosinophils Absolute: 0.3 10*3/uL (ref 0.0–0.7)
Eosinophils Relative: 7 % — ABNORMAL HIGH (ref 0–5)
HEMATOCRIT: 35.9 % — AB (ref 39.0–52.0)
Hemoglobin: 11.3 g/dL — ABNORMAL LOW (ref 13.0–17.0)
LYMPHS PCT: 30 % (ref 12–46)
Lymphs Abs: 1 10*3/uL (ref 0.7–4.0)
MCH: 25.7 pg — ABNORMAL LOW (ref 26.0–34.0)
MCHC: 31.5 g/dL (ref 30.0–36.0)
MCV: 81.6 fL (ref 78.0–100.0)
Monocytes Absolute: 0.4 10*3/uL (ref 0.1–1.0)
Monocytes Relative: 12 % (ref 3–12)
NEUTROS ABS: 1.7 10*3/uL (ref 1.7–7.7)
Neutrophils Relative %: 51 % (ref 43–77)
PLATELETS: 182 10*3/uL (ref 150–400)
RBC: 4.4 MIL/uL (ref 4.22–5.81)
RDW: 12.8 % (ref 11.5–15.5)
WBC: 3.4 10*3/uL — AB (ref 4.0–10.5)

## 2015-01-21 LAB — APTT: APTT: 30 s (ref 24–37)

## 2015-01-21 LAB — LIPASE, BLOOD: LIPASE: 131 U/L — AB (ref 22–51)

## 2015-01-21 LAB — PROTIME-INR
INR: 1.15 (ref 0.00–1.49)
Prothrombin Time: 14.9 seconds (ref 11.6–15.2)

## 2015-01-21 MED ORDER — HYDROMORPHONE HCL 1 MG/ML IJ SOLN: 0.5000 mg | INTRAMUSCULAR | Status: DC | PRN

## 2015-01-21 MED ORDER — ONDANSETRON HCL 4 MG PO TABS: 4.0000 mg | ORAL_TABLET | Freq: Four times a day (QID) | ORAL | Status: DC | PRN

## 2015-01-21 MED ORDER — IOHEXOL 300 MG/ML  SOLN
30.0000 mL | Freq: Once | INTRAMUSCULAR | Status: AC | PRN
  Administered 2015-01-21: 30 mL via ORAL

## 2015-01-21 MED ORDER — IOPAMIDOL (ISOVUE-300) INJECTION 61%
125.0000 mL | Freq: Once | INTRAVENOUS | Status: AC | PRN
  Administered 2015-01-21: 125 mL via INTRAVENOUS

## 2015-01-21 MED ORDER — ACETAMINOPHEN 650 MG RE SUPP: 650.0000 mg | Freq: Four times a day (QID) | RECTAL | Status: DC | PRN

## 2015-01-21 MED ORDER — ONDANSETRON HCL 4 MG/2ML IJ SOLN: 4.0000 mg | Freq: Four times a day (QID) | INTRAMUSCULAR | Status: DC | PRN

## 2015-01-21 MED ORDER — SODIUM CHLORIDE 0.9 % IV SOLN
INTRAVENOUS | Status: DC
  Administered 2015-01-21 – 2015-01-22 (×2): via INTRAVENOUS

## 2015-01-21 MED ORDER — ALUM & MAG HYDROXIDE-SIMETH 200-200-20 MG/5ML PO SUSP: 30.0000 mL | Freq: Four times a day (QID) | ORAL | Status: DC | PRN

## 2015-01-21 MED ORDER — HEPARIN (PORCINE) IN NACL 100-0.45 UNIT/ML-% IJ SOLN
1800.0000 [IU]/h | INTRAMUSCULAR | Status: DC
  Administered 2015-01-21: 1800 [IU]/h via INTRAVENOUS
  Filled 2015-01-21 (×2): qty 250

## 2015-01-21 MED ORDER — PANTOPRAZOLE SODIUM 40 MG IV SOLR
40.0000 mg | Freq: Two times a day (BID) | INTRAVENOUS | Status: DC
  Administered 2015-01-21 – 2015-01-22 (×2): 40 mg via INTRAVENOUS
  Filled 2015-01-21 (×3): qty 40

## 2015-01-21 MED ORDER — ACETAMINOPHEN 325 MG PO TABS: 650.0000 mg | ORAL_TABLET | Freq: Four times a day (QID) | ORAL | Status: DC | PRN

## 2015-01-21 MED ORDER — HEPARIN BOLUS VIA INFUSION
2700.0000 [IU] | Freq: Once | INTRAVENOUS | Status: AC
  Administered 2015-01-21: 2700 [IU] via INTRAVENOUS
  Filled 2015-01-21: qty 2700

## 2015-01-21 NOTE — Progress Notes (Signed)
ANTICOAGULATION CONSULT NOTE - Initial Consult  Pharmacy Consult for IV Heparin Indication: Splenic Vein Thrombosis  No Known Allergies  Patient Measurements: Height:  (175.3 cm) Weight: 238 lb 11.2 oz (108.274 kg) IBW/kg (Calculated) : 70.7 Heparin Dosing Weight: 94 kg  Vital Signs: Temp: 98.7 F (37.1 C) (08/26 1927) Temp Source: Oral (08/26 1927) BP: 132/68 mmHg (08/26 1927) Pulse Rate: 93 (08/26 1927)  Labs: No results for input(s): HGB, HCT, PLT, APTT, LABPROT, INR, HEPARINUNFRC, CREATININE, CKTOTAL, CKMB, TROPONINI in the last 72 hours.  CrCl cannot be calculated (Patient has no serum creatinine result on file.).   Medical History: Past Medical History  Diagnosis Date  . Autism   . 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  CT on admission shows complicated pancreatitis with new loculated fluid collections and splenic vein occlusion newly seen from July 2016.  Pharmacy consulted to begin treatment with IV heparin.  Baseline aPTT, PT/INR, CBC, CMET ordered.  Goal of Therapy:  Heparin level 0.3-0.7 units/ml Monitor platelets by anticoagulation protocol: Yes   Plan:  1.  Heparin 2700 unit IV bolus then start infusion at 1800 units/hr per [place]. 2.  Check heparin level 6 hours after start of infusion.   3.  Daily heparin level and CBC while on heparin.  Clance Boll 01/21/2015,8:36 PM

## 2015-01-21 NOTE — H&P (Signed)
[facility] Admission History and Physical       Daryl Foster WJX:[id number] DOB: 1995/11/18 DOA: 01/21/2015  Referring physician:  PCP: Martha Clan, MD  Specialists: Dr [facility]  Chief Complaint: ABD Pain and Nausea   HPI: Daryl Foster is a 19 y.o. male with a history of Autism recently hospitalized on 12/17/2014 for Idiopathic Pancreatitis who was sent for direct admission by his PCP Dr Martha Clan due to continued symptoms of Upper ABD Pain and Nausea with intermittent Fevers.  He has had poor intake of foods due to his symptoms.   He has Vicodin as needed for his pain, and family reports that he has a very high pain tolerance and when he lets them know he has pain he is in severe pain.   His Lipase was rechecked today and was found to be 250, and he was sent for a CT scan of the ABD today as an outpatient which revealed Pancreatitis and Pseudocysts and Splenic Venin Thrombosis.   [facility] was contacted and Dr Clelia Croft spoke with Dr. Russella Dar who communicated with Dr Leone Payor who is on this weekend.   The patient was seen and evaluated and appears to be in no significant discomfort at this time and is hemodynamically stable.   Dr. Leone Payor was notified and will see the patient in the AM.   Family is at the bedside and are very supportive.   The family gives the medical history.      Review of Systems:   Constitutional: No Weight Loss, No Weight Gain, Night Sweats, +Fevers, Chills, Dizziness, Light Headedness, Fatigue, or Generalized Weakness HEENT: No Headaches, Difficulty Swallowing,Tooth/Dental Problems,Sore Throat,  No Sneezing, Rhinitis, Ear Ache, Nasal Congestion, or Post Nasal Drip,  Cardio-vascular:  No Chest pain, Orthopnea, PND, Edema in Lower Extremities, Anasarca, Dizziness, Palpitations  Resp: No Dyspnea, No DOE, No Productive Cough, No Non-Productive Cough, No Hemoptysis, No Wheezing.    GI: No Heartburn, Indigestion, +Abdominal Pain, Nausea, Vomiting,  Diarrhea, Constipation, Hematemesis, Hematochezia, Melena, Change in Bowel Habits,  Loss of Appetite  GU: No Dysuria, No Change in Color of Urine, No Urgency or Urinary Frequency, No Flank pain.  Musculoskeletal: No Joint Pain or Swelling, No Decreased Range of Motion, No Back Pain.  Neurologic: No Syncope, No Seizures, Muscle Weakness, Paresthesia, Vision Disturbance or Loss, No Diplopia, No Vertigo, No Difficulty Walking,  Skin: No Rash or Lesions. Psych: No Change in Mood or Affect, No Depression or Anxiety, No Memory loss, No Confusion, or Hallucinations   Past Medical History  Diagnosis Date  . Autism   . 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    Prior to Admission medications   Medication Sig Start Date End Date Taking? Authorizing Provider  acetaminophen (TYLENOL) 325 MG tablet Take 2 tablets (650 mg total) by mouth every 6 (six) hours as needed for fever. 12/28/14   Sherrie George, PA-C  HYDROcodone-acetaminophen (NORCO/VICODIN) 5-325 MG per tablet Take 1 tablet by mouth 8 days. 12/31/14   Lori P Hvozdovic, PA-C  ondansetron (ZOFRAN) 4 MG tablet Take 1 tablet (4 mg total) by mouth 2 (two) times daily. 12/31/14   Lori P Hvozdovic, PA-C  Pediatric Multivit-Minerals-C (RA GUMMY VITAMINS & MINERALS PO) Take 1 tablet by mouth daily. Gummy vitamin not a tablet    Historical Provider, MD     No Known Allergies     Social History:  reports that he has never smoked.  He has never used smokeless tobacco. He reports that he does not drink alcohol or use illicit drugs.    Family History  Problem Relation Age of Onset  . Asthma Father        Biological Mother  Had Lupus   Physical Exam:  GEN:  Pleasant Obese 19 y.o. Caucasian male examined and in no acute distress; cooperative with exam Filed Vitals:   01/21/15 1927  BP: 132/68  Pulse: 93  Temp: 98.7 F (37.1 C)  TempSrc: Oral  Resp: 20  Height:  (1.753 m)  Weight: 108.274 kg (238  lb 11.2 oz)   Blood pressure 132/68, pulse 93, temperature 98.7 F (37.1 C), temperature source Oral, resp. rate 20, height  (1.753 m), weight 108.274 kg (238 lb 11.2 oz). PSYCH: He is alert and oriented x4; does not appear anxious does not appear depressed; affect is normal HEENT: Normocephalic and Atraumatic, Mucous membranes pink; PERRLA; EOM intact; Fundi:  Benign;  No scleral icterus, Nares: Patent, Oropharynx: Clear, Fair Dentition,    Neck:  FROM, No Cervical Lymphadenopathy nor Thyromegaly or Carotid Bruit; No JVD; Breasts:: Not examined CHEST WALL: No tenderness CHEST: Normal respiration, clear to auscultation bilaterally HEART: Regular rate and rhythm; no murmurs rubs or gallops BACK: No kyphosis or scoliosis; No CVA tenderness ABDOMEN: Positive Bowel Sounds, Obese, Soft Mildly tender in the Epigastrium, No Rebound or Guarding; No Masses, No Organomegaly, No Pannus; No Intertriginous candida. Rectal Exam: Not done EXTREMITIES: No Cyanosis, Clubbing, or Edema; No Ulcerations. Genitalia: not examined PULSES: 2+ and symmetric SKIN: Normal hydration no rash or ulceration CNS:  Alert and Oriented x 4, No Focal Deficits Vascular: pulses palpable throughout    Labs on Admission:  Basic Metabolic Panel: No results for input(s): NA, K, CL, CO2, GLUCOSE, BUN, CREATININE, CALCIUM, MG, PHOS in the last 168 hours. Liver Function Tests: No results for input(s): AST, ALT, ALKPHOS, BILITOT, PROT, ALBUMIN in the last 168 hours. No results for input(s): LIPASE, AMYLASE in the last 168 hours. No results for input(s): AMMONIA in the last 168 hours. CBC: No results for input(s): WBC, NEUTROABS, HGB, HCT, MCV, PLT in the last 168 hours. Cardiac Enzymes: No results for input(s): CKTOTAL, CKMB, CKMBINDEX, TROPONINI in the last 168 hours.  BNP (last 3 results) No results for input(s): BNP in the last 8760 hours.  ProBNP (last 3 results) No results for input(s): PROBNP in the last 8760  hours.  CBG: No results for input(s): GLUCAP in the last 168 hours.  Radiological Exams on Admission: Ct Abdomen Pelvis W Contrast  01/21/2015   CLINICAL DATA:  Follow-up pancreatitis.  Left upper quadrant pain.  EXAM: CT ABDOMEN AND PELVIS WITH CONTRAST  TECHNIQUE: Multidetector CT imaging of the abdomen and pelvis was performed using the standard protocol following bolus administration of intravenous contrast.  CONTRAST:  30mL OMNIPAQUE IOHEXOL 300 MG/ML SOLN, ISOVUE-300 IOPAMIDOL (ISOVUE-300) INJECTION 61%  COMPARISON:  12/21/2014  FINDINGS: BODY WALL: No contributory findings.  LOWER CHEST: Mild mosaic attenuation the lower lungs compatible with air trapping.  ABDOMEN/PELVIS:  Liver: No focal abnormality.  Biliary: No evidence of biliary obstruction or stone.  Pancreas: The pancreas remains enlarged and indistinct consistent with pancreatitis. There is loculated fluid dissecting around the pancreas with patchy peripheral rim enhancement but no mature rind. There is a distinct 3 cm collection in the splenorenal ligament. A collection extending ventrally around the pancreatic body measures 6 x 4 x 3 cm. This collection extends posteriorly and inferiorly  around the pancreas where there is an additional 3 cm collection. Thrombosis of the splenic vein which has developed since July, but is chronic based on extensive collateral formation already seen. Small reactive ascites is present. No evidence of pseudoaneurysm. Marked reactive thickening of the gastric wall with submucosal edema.  Spleen: New enlargement in the setting of splenic vein thrombosis.  Adrenals: Unremarkable.  Kidneys and ureters: No hydronephrosis or stone.  Bladder: Unremarkable.  Reproductive: No pathologic findings.  Bowel: Reactive gastritis with short gastric varices seen proximally. No bowel obstruction  Retroperitoneum: Reactive upper abdominal adenopathy.  Peritoneum: No ascites or pneumoperitoneum.  Vascular: No acute  abnormality.  OSSEOUS: No acute abnormalities.  IMPRESSION: 1. Complicated pancreatitis with new loculated fluid collections measuring up to 6 x 4 x 3 cm. 2. Splenic vein occlusion is newly seen from July 2016 but chronic based on well-developed collaterals and splenomegaly. Short gastric varices are present. 3. Reactive gastritis.   Electronically Signed   By: Marnee Spring M.D.   On: 01/21/2015 14:54     Assessment/Plan:   19 y.o. male with  Active Problems:    1.    Acute pancreatitis   Bowel Rest, Sips and Ice Chips PO    (Family only agreeable to TPN not Tube Feeding if needed for Nutrition)   IVFs   Monitor Lipase Levels   GI:  Dr Leone Payor to see  In AM      2.    Nausea without vomiting   PRN IV Zofran      3.    Abdominal pain, epigastric- Due to #1, #4, and #5   Pain Control with IV Dilaudid PRN      4.    Pseudocyst of pancreas   Monitor Sxs      5.    Splenic vein thrombosis   IV Heparin    Monitor PLTs   Monitor CBC      6.    DVT Prophylaxis   On IV Heparin    Code Status:     FULL CODE   Family Communication:   Family at Bedside  Disposition Plan:    Inpatient  Status        Time spent:  80 Minutes      Ron Parker [facility] Pager [phone]   If 7AM -7PM Please Contact the Day Rounding Team MD for [facility]  If 7PM-7AM, Please Contact Night-Floor Coverage  [email or web address] Password [facility] 01/21/2015, 8:26 PM     ADDENDUM:   Patient was seen and examined on 01/21/2015

## 2015-01-22 DIAGNOSIS — D735 Infarction of spleen: Secondary | ICD-10-CM

## 2015-01-22 DIAGNOSIS — K859 Acute pancreatitis, unspecified: Secondary | ICD-10-CM

## 2015-01-22 DIAGNOSIS — K868 Other specified diseases of pancreas: Secondary | ICD-10-CM

## 2015-01-22 DIAGNOSIS — R1013 Epigastric pain: Secondary | ICD-10-CM

## 2015-01-22 LAB — CBC
HCT: 33.4 % — ABNORMAL LOW (ref 39.0–52.0)
HEMOGLOBIN: 10.2 g/dL — AB (ref 13.0–17.0)
MCH: 25.1 pg — AB (ref 26.0–34.0)
MCHC: 30.5 g/dL (ref 30.0–36.0)
MCV: 82.1 fL (ref 78.0–100.0)
PLATELETS: 166 10*3/uL (ref 150–400)
RBC: 4.07 MIL/uL — ABNORMAL LOW (ref 4.22–5.81)
RDW: 12.8 % (ref 11.5–15.5)
WBC: 3.4 10*3/uL — ABNORMAL LOW (ref 4.0–10.5)

## 2015-01-22 LAB — BASIC METABOLIC PANEL
Anion gap: 10 (ref 5–15)
BUN: 6 mg/dL (ref 6–20)
CALCIUM: 9.1 mg/dL (ref 8.9–10.3)
CO2: 24 mmol/L (ref 22–32)
CREATININE: 0.83 mg/dL (ref 0.61–1.24)
Chloride: 106 mmol/L (ref 101–111)
GFR calc Af Amer: >60 mL/min (ref >60)
GFR calc non Af Amer: >60 mL/min (ref >60)
GLUCOSE: 87 mg/dL (ref 65–99)
Potassium: 3.7 mmol/L (ref 3.5–5.1)
Sodium: 140 mmol/L (ref 135–145)

## 2015-01-22 LAB — HEPARIN LEVEL (UNFRACTIONATED): HEPARIN UNFRACTIONATED: 0.36 [IU]/mL (ref 0.30–0.70)

## 2015-01-22 LAB — LIPASE, BLOOD: LIPASE: 114 U/L — AB (ref 22–51)

## 2015-01-22 MED ORDER — RIVAROXABAN (XARELTO) VTE STARTER PACK (15 & 20 MG): ORAL_TABLET | ORAL | Status: DC

## 2015-01-22 NOTE — Consult Note (Signed)
[facility]  [phone] [phone]  Inpatient New Consult Note   Patient Care Team: Martha Clan, MD as PCP - General (Internal Medicine) 01/22/2015  Referral physician: Dr. Butler Denmark   CHIEF COMPLAINTS/PURPOSE OF CONSULTATION:  Spleen vein thrombosis   HISTORY OF PRESENTING ILLNESS:  Daryl Foster 19 y.o. male with past medical history of autism who was admitted to hospital for recurrent hepatitis. I was called to see him because of newly discovered splenic vein thrombosis.  He was hospitalized on 12/17/2014 for idiopathic pancreatitis. He has had intermittent abdominal pain, fever and low appetite since then. He was seen by his primary care physician again in the office and was found to have elevated lipase, and a repeated CT scan of abdomen showed pancreatitis versus pseudocysts and a newly discovered spleen vein thrombosis. He was admitted to the hospital for further management. He actually feels well today, tolerating diet, and may go home today.    MEDIC pancreatitisAL HISTORY:  Past Medical History  Diagnosis Date  . Autism   . 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 No pertinent past surgical history.  SOCIAL HISTORY: Social History   Social History  . Marital Status: Single    Spouse Name: N/A  . Number of Children: N/A  . Years of Education: N/A   Occupational History  . Not on file.   Social History Main Topics  . Smoking status: Never Smoker   . Smokeless tobacco: Never Used  . Alcohol Use: No  . Drug Use: No  . Sexual Activity: Not on file   Other Topics Concern  . Not on file   Social History Narrative    FAMILY HISTORY: Family History  Problem Relation Age of Onset  . Adopted: Yes  . Asthma Father   . Lupus Mother     Biological Mother    ALLERGIES:  has No Known Allergies.  MEDICATIONS:  Current Facility-Administered Medications  Medication Dose Route  Frequency Provider Last Rate Last Dose  . 0.9 %  sodium chloride infusion   Intravenous Continuous Calvert Cantor, MD 75 mL/hr at 01/22/15 1055    . acetaminophen (TYLENOL) tablet 650 mg  650 mg Oral Q6H PRN Ron Parker, MD       Or  . acetaminophen (TYLENOL) suppository 650 mg  650 mg Rectal Q6H PRN Ron Parker, MD      . alum & mag hydroxide-simeth (MAALOX/MYLANTA) 200-200-20 MG/5ML suspension 30 mL  30 mL Oral Q6H PRN Ron Parker, MD      . HYDROmorphone (DILAUDID) injection 0.5-1 mg  0.5-1 mg Intravenous Q3H PRN Ron Parker, MD      . ondansetron (ZOFRAN) tablet 4 mg  4 mg Oral Q6H PRN Ron Parker, MD       Or  . ondansetron (ZOFRAN) injection 4 mg  4 mg Intravenous Q6H PRN Ron Parker, MD      . pantoprazole (PROTONIX) injection 40 mg  40 mg Intravenous Q12H Ron Parker, MD   40 mg at 01/22/15 1055    REVIEW OF SYSTEMS:   Constitutional: Denies fevers, chills or abnormal night sweats Eyes: Denies blurriness of vision, double vision or watery eyes Ears, nose, mouth, throat, and face: Denies mucositis or sore throat Respiratory: Denies cough, dyspnea or wheezes Cardiovascular: Denies palpitation, chest discomfort or lower extremity swelling Gastrointestinal:  Denies nausea, heartburn or change in bowel habits Skin: Denies abnormal skin rashes Lymphatics:  Denies new lymphadenopathy or easy bruising Neurological:Denies numbness, tingling or new weaknesses Behavioral/Psych: Mood is stable, no new changes  All other systems were reviewed with the patient and are negative.  PHYSICAL EXAMINATION:  Filed Vitals:   01/22/15 0733  BP: 134/70  Pulse: 82  Temp: 98.4 F (36.9 C)  Resp: 17   Filed Weights   01/21/15 1927  Weight: 238 lb 11.2 oz (108.274 kg)    GENERAL:alert, no distress and comfortable SKIN: skin color, texture, turgor are normal, no rashes or significant lesions EYES: normal, conjunctiva are pink and non-injected,  sclera clear OROPHARYNX:no exudate, no erythema and lips, buccal mucosa, and tongue normal  NECK: supple, thyroid normal size, non-tender, without nodularity LYMPH:  no palpable lymphadenopathy in the cervical, axillary or inguinal LUNGS: clear to auscultation and percussion with normal breathing effort HEART: regular rate & rhythm and no murmurs and no lower extremity edema ABDOMEN:abdomen soft, non-tender and normal bowel sounds Musculoskeletal:no cyanosis of digits and no clubbing  PSYCH: alert & oriented x 3 with fluent speech NEURO: no focal motor/sensory deficits  LABORATORY DATA:  I have reviewed the data as listed CBC Latest Ref Rng 01/22/2015 01/21/2015 12/30/2014  WBC 4.0 - 10.5 K/uL 3.4(L) 3.4(L) 12.2(H)  Hemoglobin 13.0 - 17.0 g/dL 10.2(L) 11.3(L) -  Hematocrit 39.0 - 52.0 % 33.4(L) 35.9(L) -  Platelets 150 - 400 K/uL 166 182 -    CMP Latest Ref Rng 01/22/2015 01/21/2015 12/30/2014  Glucose 65 - 99 mg/dL 87 92 -  BUN 6 - 20 mg/dL 6 6 -  Creatinine 1.61 - 1.24 mg/dL 0.96 0.45 -  Sodium 409 - 145 mmol/L 140 139 -  Potassium 3.5 - 5.1 mmol/L 3.7 4.1 -  Chloride 101 - 111 mmol/L 106 105 -  CO2 22 - 32 mmol/L 24 27 -  Calcium 8.9 - 10.3 mg/dL 9.1 9.3 -  Total Protein 6.5 - 8.1 g/dL - 7.0 7.2  Total Bilirubin 0.3 - 1.2 mg/dL - 0.6 0.4  Alkaline Phos 38 - 126 U/L - 94 68  AST 15 - 41 U/L - 30 21  ALT 17 - 63 U/L - 28 26     RADIOGRAPHIC STUDIES: I have personally reviewed the radiological images as listed and agreed with the findings in the report.   Ct Abdomen Pelvis W Contrast 01/21/2015    FINDINGS: BODY WALL: No contributory findings.  LOWER CHEST: Mild mosaic attenuation the lower lungs compatible with air trapping.  ABDOMEN/PELVIS:  Liver: No focal abnormality.  Biliary: No evidence of biliary obstruction or stone.  Pancreas: The pancreas remains enlarged and indistinct consistent with pancreatitis. There is loculated fluid dissecting around the pancreas with patchy  peripheral rim enhancement but no mature rind. There is a distinct 3 cm collection in the splenorenal ligament. A collection extending ventrally around the pancreatic body measures 6 x 4 x 3 cm. This collection extends posteriorly and inferiorly around the pancreas where there is an additional 3 cm collection. Thrombosis of the splenic vein which has developed since July, but is chronic based on extensive collateral formation already seen. Small reactive ascites is present. No evidence of pseudoaneurysm. Marked reactive thickening of the gastric wall with submucosal edema.  Spleen: New enlargement in the setting of splenic vein thrombosis.  Adrenals: Unremarkable.  Kidneys and ureters: No hydronephrosis or stone.  Bladder: Unremarkable.  Reproductive: No pathologic findings.  Bowel: Reactive gastritis with short gastric varices seen proximally. No bowel obstruction  Retroperitoneum: Reactive upper abdominal adenopathy.  Peritoneum:  No ascites or pneumoperitoneum.  Vascular: No acute abnormality.  OSSEOUS: No acute abnormalities.  IMPRESSION: 1. Complicated pancreatitis with new loculated fluid collections measuring up to 6 x 4 x 3 cm. 2. Splenic vein occlusion is newly seen from July 2016 but chronic based on well-developed collaterals and splenomegaly. Short gastric varices are present. 3. Reactive gastritis.   Electronically Signed   By: Marnee Spring M.D.   On: 01/21/2015 14:54   Mr  Abdomen w wo contrast 12/24/2014    FINDINGS: Exam is suboptimal. Often patients with pancreatitis have difficulty breath holding for exam. Additionally this patient in had difficulty following breathing instructions.  Lower chest:  Lung bases are clear.  Hepatobiliary: No biliary duct dilatation. No focal hepatic lesion. No hepatic steatosis. Gallbladder is normal. There are no gallstones within the lumen of the gallbladder. The common bile duct is difficult to follow but does not appear dilated.  Pancreas: There is extensive  inflammation surrounding the pancreas head body and tail. Fluid along the anterior perirenal fascia. No organized fluid collections are identified. 1 small focus high intensity fluid measuring 12 mm x 10 mm ventral to the pancreatic head on image 53 of series 1400 suggests a small proteinaceous fluid collection. Pancreas enhances uniformly on the post-contrast exam. There is no obvious variant pancreatic ductal anatomy.  Spleen: Normal spleen. Splenic vein is patent. Portal veins are patent.  Adrenals/urinary tract: Adrenal glands and kidneys are normal.  Stomach/Bowel: Stomach and limited of the small bowel is unremarkable  Vascular/Lymphatic: Abdominal aortic normal caliber. No retroperitoneal periportal lymphadenopathy.  Musculoskeletal: No aggressive osseous lesion  IMPRESSION: 1. No cholelithiasis.  Common bile duct appears normal caliber. 2. No clear variant pancreatic ductal anatomy. 3. Exam is somewhat suboptimal due to poor breath holding. 4. Findings consist with acute pancreatitis with pancreatic inflammation and retroperitoneal fluid. 5. No evidence of pancreatic necrosis.   Electronically Signed   By: Genevive Bi M.D.   On: 12/24/2014 20:03     ASSESSMENT:   19 year old Caucasian male, with past medical history of autism, recent history of pancreatitis, who was incidentally found to have a splenic vein thrombosis. No personal or family history of thrombosis.   1. Splenic vein thrombosis, likely subacute -He had CT and abdominal MRI on 12/24/2014, which showed patent splenic vein, so the splenic vein thrombosis likely developed in the past months. Due to the collateral vessels, this is likely subacute. -The etiology of the thrombosis is unclear, I think possibly related to the chronic inflammation from his pancreatitis -The consequences of untreated spleen vein thrombosis is splenomegaly and hypersplenism  -Giving the subacute thrombosis, the benefit of anticoagulation is probably small to  moderate. However he does not have high-risk for bleeding, so I think the benefit is probably still outweight the bleeding risk, and I will recommend anticoagulation for 6 months.  -We discussed the option of different blood thinner.  I recommend Xarelto, 15 mg twice daily for 21 days, then 20 mg daily for 6 months. If he has high co-pay for Xarelto, I would recommend Coumadin. He can follow up with his primary care physician for his and coordination.  -I have discussed the above in great detail with patient and his father at bedside. They agree with the plan.  -He likely will have repeat a CT scan done the road for his pancreatitis and pseudocyst, we will see if his splenic vein thrombosis on next scan.  -I do not recommend hypercoagulopathy workup for now or after he  completes 6 month of therapy. I think this is more likely provoked event, I do not feel hypercoagulopathy workup is absolutely necessary.  -I'll see him as needed in the future. I gave them my contact information, they'll call us if needed.   I have discussed with Drs. Rizwan and Leone Payor about the above plan.   All questions were answered. The patient knows to call the clinic with any problems, questions or concerns. I spent 40 minutes counseling the patient face to face. The total time spent in the appointment was 45 minutes and more than 50% was on counseling.     Malachy Mood, MD 01/22/2015 1:29 PM

## 2015-01-22 NOTE — Progress Notes (Deleted)
[facility] Progress Note   BENJY KANA WUJ:[id number] DOB: 10/25/1995 DOA: 01/21/2015 PCP: Martha Clan, MD  Brief narrative: Daryl Foster is a 19 y.o. male with autism who was admitted from 7/22 through 8/2 for acute pancreatitis. Workup including a CT scan and an MRCP was unrevealing and it was thought that it was idiopathic. He was seen by his PCP for ongoing fevers, lipase was noted to be elevated, underwent CT of the abdomen and pelvis which revealed a loculated pancreatic fluid collection and splenic vein thrombosis with collaterals.   Subjective: Currently no abdominal pain or nausea. According to the patient's mother, while he was at home he would wake up asymptomatic but as the day progressed he would develop more pain and nausea with the worst being towards the end of the day.  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 and therefore suspicion is this may be chronic-have held IV heparin for now-hematology consult to further decide on anticoagulation    Abdominal pain, epigastric -Due to pancreatitis-has been using Vicodin at home with good response   Code Status:     Code Status Orders        Start     Ordered   01/21/15 2031  Full code   Continuous     01/21/15 2030     Family Communication: Mother at bedside Disposition Plan:  DVT prophylaxis: SCDs-was also receiving heparin IV Consultants: GI, oncology Procedures:  Antibiotics: Anti-infectives    None      Objective: Filed Weights   01/21/15 1927  Weight: 108.274 kg (238 lb 11.2 oz)    Intake/Output Summary (Last 24 hours) at 01/22/15 1254 Last data filed at 01/22/15 0600  Gross per 24  hour  Intake 717.65 ml  Output      0 ml  Net 717.65 ml     Vitals Filed Vitals:   01/21/15 1927 01/22/15 0024 01/22/15 0459 01/22/15 0733  BP: 132/68 137/66 119/61 134/70  Pulse: 93 84 77 82  Temp: 98.7 F (37.1 C) 98.3 F (36.8 C) 97.8 F (36.6 C) 98.4 F (36.9 C)  TempSrc: Oral Oral Oral Oral  Resp: 20 18 18 17   Height: 5\' 9"  (1.753 m)     Weight: 108.274 kg (238 lb 11.2 oz)     SpO2:  100% 99% 99%    Exam:  General:  Pt is alert, not in acute distress  HEENT: No icterus, No thrush, oral mucosa moist  Cardiovascular: regular rate and rhythm, S1/S2 No murmur  Respiratory: clear to auscultation bilaterally   Abdomen: Soft, +Bowel sounds, non tender, non distended, no guarding  MSK: No LE edema, cyanosis or clubbing  Data Reviewed: Basic Metabolic Panel:  Recent Labs Lab 01/21/15 2110 01/22/15 0520  NA 139 140  K 4.1 3.7  CL 105 106  CO2 27 24  GLUCOSE 92 87  BUN 6 6  CREATININE 0.86 0.83  CALCIUM 9.3 9.1   Liver Function Tests:  Recent Labs Lab 01/21/15 2110  AST 30  ALT 28  ALKPHOS 94  BILITOT 0.6  PROT 7.0  ALBUMIN 3.8    Recent Labs Lab 01/21/15 2110  LIPASE 131*   No results for input(s): AMMONIA in the  last 168 hours. CBC:  Recent Labs Lab 01/21/15 2110 01/22/15 0520  WBC 3.4* 3.4*  NEUTROABS 1.7  --   HGB 11.3* 10.2*  HCT 35.9* 33.4*  MCV 81.6 82.1  PLT 182 166   Cardiac Enzymes: No results for input(s): CKTOTAL, CKMB, CKMBINDEX, TROPONINI in the last 168 hours. BNP (last 3 results) No results for input(s): BNP in the last 8760 hours.  ProBNP (last 3 results) No results for input(s): PROBNP in the last 8760 hours.  CBG: No results for input(s): GLUCAP in the last 168 hours.  No results found for this or any previous visit (from the past 240 hour(s)).   Studies: Ct Abdomen Pelvis W Contrast  01/21/2015   CLINICAL DATA:  Follow-up pancreatitis.  Left upper quadrant pain.  EXAM: CT ABDOMEN AND PELVIS WITH CONTRAST   TECHNIQUE: Multidetector CT imaging of the abdomen and pelvis was performed using the standard protocol following bolus administration of intravenous contrast.  CONTRAST:  30mL OMNIPAQUE IOHEXOL 300 MG/ML SOLN, ISOVUE-300 IOPAMIDOL (ISOVUE-300) INJECTION 61%  COMPARISON:  12/21/2014  FINDINGS: BODY WALL: No contributory findings.  LOWER CHEST: Mild mosaic attenuation the lower lungs compatible with air trapping.  ABDOMEN/PELVIS:  Liver: No focal abnormality.  Biliary: No evidence of biliary obstruction or stone.  Pancreas: The pancreas remains enlarged and indistinct consistent with pancreatitis. There is loculated fluid dissecting around the pancreas with patchy peripheral rim enhancement but no mature rind. There is a distinct 3 cm collection in the splenorenal ligament. A collection extending ventrally around the pancreatic body measures 6 x 4 x 3 cm. This collection extends posteriorly and inferiorly around the pancreas where there is an additional 3 cm collection. Thrombosis of the splenic vein which has developed since July, but is chronic based on extensive collateral formation already seen. Small reactive ascites is present. No evidence of pseudoaneurysm. Marked reactive thickening of the gastric wall with submucosal edema.  Spleen: New enlargement in the setting of splenic vein thrombosis.  Adrenals: Unremarkable.  Kidneys and ureters: No hydronephrosis or stone.  Bladder: Unremarkable.  Reproductive: No pathologic findings.  Bowel: Reactive gastritis with short gastric varices seen proximally. No bowel obstruction  Retroperitoneum: Reactive upper abdominal adenopathy.  Peritoneum: No ascites or pneumoperitoneum.  Vascular: No acute abnormality.  OSSEOUS: No acute abnormalities.  IMPRESSION: 1. Complicated pancreatitis with new loculated fluid collections measuring up to 6 x 4 x 3 cm. 2. Splenic vein occlusion is newly seen from July 2016 but chronic based on well-developed collaterals and  splenomegaly. Short gastric varices are present. 3. Reactive gastritis.   Electronically Signed   By: Marnee Spring M.D.   On: 01/21/2015 14:54    Scheduled Meds:  Scheduled Meds: . pantoprazole (PROTONIX) IV  40 mg Intravenous Q12H   Continuous Infusions: . sodium chloride 75 mL/hr at 01/22/15 1055    Time spent on care of this patient: 30 minutes   Emeril Stille, MD 01/22/2015, 12:54 PM  LOS: 1 day   [facility] Office  [phone] Pager - Text Page per [email or web address] If 7PM-7AM, please contact night-coverage [email or web address]

## 2015-01-22 NOTE — Consult Note (Signed)
Referring Provider: Dr. Clelia Croft and Dr. Roxy Cedar - [facility] Primary Care Physician:  Martha Clan, MD Primary Gastroenterologist:  Dr. Russella Dar  Reason for Consultation: Recurrent, persistent pancreatitis and splenic vein thrombosis  HPI: Daryl Foster is a 19 y.o. male with autism who is status post recent admission to [facility] from 12/17/2014 through 12/28/2014. He presented to the emergency room on July 22 with abdominal pain and 2 episodes of vomiting. He had a low-grade temperature of 99.9. White blood count was up to 16.2 with a left shift and lipase was elevated at 383. CAT scan showed inflammatory changes noted around the pancreatic body and tail suggesting acute pancreatitis. The appendix was mildly enlarged with mild surrounding inflammation concerning for appendicitis. The patient was admitted for bowel rest and IV hydration he was evaluated by surgery it was felt he did not have appendicitis. He had an ultrasound on July 22 that showed a normal gallbladder, no cholelithiasis and the common bile duct was 3 mm, the liver was noted to have increased hepatic echogenicity indicating underlying steatosis without focal abnormality or intrahepatic ductal dilatation. His diet was advanced after 2 days, but he started vomiting and he was back down to ice chips. He continued to have pain and repeat CT scan was done on July 26 showing inflammatory changes around the pancreatic body and tail consistent with acute pancreatitis. Inflammatory changes were noted in the mesenteric regions, but no defined fluid collection or pseudocyst is seen. Lipase normalized and then went back up his maximum lipase on July 31 was 918. His white blood count normalized after admission on July 27. He was evaluated by GI on July 29 of an MRCP was ordered no real cause for pancreatitis was established; the MRCP noted no cholelithiasis and the common bile duct appeared normal. There is no clear evidence of pancreatic  ductal abnormality. After a week of being in the hospital the patient's family insisted on trying some more solid food and so surgery allowed him to have bland food and he was discharged home.  The patient has multiple issues with food consistencies and prefers to eat only fried foods. He does not eat meats, vegetables, fruits or dairy. His parents state that he typically eats french fries, onion rings, or tater tots for 3 meals a day.   Patient was seen by PCP, Dr. Clelia Croft, on 8/25 for ongoing low-grade fevers (had temp of 101 on one occasion, but otherwise in the 99 range), and some ongoing pain.  CT scan abdomen and pelvis with contrast was performed on 826 and showed the following:  IMPRESSION: 1. Complicated pancreatitis with new loculated fluid collections measuring up to 6 x 4 x 3 cm. 2. Splenic vein occlusion is newly seen from July 2016 but chronic based on well-developed collaterals and splenomegaly. Short gastric varices are present. 3. Reactive gastritis.  Lipase 131.  Heparin bolus was given and gtt started but now has been discontinued by the hospitalist.  Patient is resting comfortably during my visit.  Spoke with patient's mom.  Has not been complaining of much pain past couple of days.  Some nausea but no vomiting.  Takes some miralax, which helps him with constipation (likely due to pain meds), but hasn't moved his bowels in 3 days now.   Past Medical History  Diagnosis Date  . Autism   . 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 No pertinent past surgical history.  Prior to Admission medications   Medication  Sig Start Date End Date Taking? Authorizing Provider  acetaminophen (TYLENOL) 325 MG tablet Take 2 tablets (650 mg total) by mouth every 6 (six) hours as needed for fever. 12/28/14  Yes Sherrie George, PA-C  HYDROcodone-acetaminophen (NORCO/VICODIN) 5-325 MG per tablet Take 1 tablet by mouth 8 days. Patient taking differently: Take 1 tablet by  mouth every 6 (six) hours as needed for moderate pain or severe pain.  12/31/14  Yes Lori P Hvozdovic, PA-C  ondansetron (ZOFRAN) 4 MG tablet Take 1 tablet (4 mg total) by mouth 2 (two) times daily. 12/31/14  Yes Lori P Hvozdovic, PA-C    Current Facility-Administered Medications  Medication Dose Route Frequency Provider Last Rate Last Dose  . 0.9 %  sodium chloride infusion   Intravenous Continuous Ron Parker, MD 75 mL/hr at 01/21/15 2217    . acetaminophen (TYLENOL) tablet 650 mg  650 mg Oral Q6H PRN Ron Parker, MD       Or  . acetaminophen (TYLENOL) suppository 650 mg  650 mg Rectal Q6H PRN Ron Parker, MD      . alum & mag hydroxide-simeth (MAALOX/MYLANTA) 200-200-20 MG/5ML suspension 30 mL  30 mL Oral Q6H PRN Harvette Velora Heckler, MD      . HYDROmorphone (DILAUDID) injection 0.5-1 mg  0.5-1 mg Intravenous Q3H PRN Ron Parker, MD      . ondansetron (ZOFRAN) tablet 4 mg  4 mg Oral Q6H PRN Ron Parker, MD       Or  . ondansetron (ZOFRAN) injection 4 mg  4 mg Intravenous Q6H PRN Ron Parker, MD      . pantoprazole (PROTONIX) injection 40 mg  40 mg Intravenous Q12H Ron Parker, MD   40 mg at 01/21/15 2223    Allergies as of 01/21/2015  . (No Known Allergies)    Family History  Problem Relation Age of Onset  . Adopted: Yes  . Asthma Father   . Lupus Mother     Biological Mother    Social History   Social History  . Marital Status: Single    Spouse Name: N/A  . Number of Children: N/A  . Years of Education: N/A   Occupational History  . Not on file.   Social History Main Topics  . Smoking status: Never Smoker   . Smokeless tobacco: Never Used  . Alcohol Use: No  . Drug Use: No  . Sexual Activity: Not on file   Other Topics Concern  . Not on file   Social History Narrative    Review of Systems: Ten point ROS is O/W negative except as mentioned in HPI.  Physical Exam: Vital signs in last 24 hours: Temp:  [97.8 F  (36.6 C)-98.7 F (37.1 C)] 98.4 F (36.9 C) (08/27 0733) Pulse Rate:  [77-93] 82 (08/27 0733) Resp:  [17-20] 17 (08/27 0733) BP: (119-137)/(61-70) 134/70 mmHg (08/27 0733) SpO2:  [99 %-100 %] 99 % (08/27 0733) Weight:  [238 lb 11.2 oz (108.274 kg)] 238 lb 11.2 oz (108.274 kg) (08/26 1927)   General:  Alert, Well-developed, well-nourished, pleasant and cooperative in NAD Head:  Normocephalic and atraumatic. Eyes:  Sclera clear, no icterus.  Conjunctiva pink. Ears:  Normal auditory acuity. Mouth:  No deformity or lesions.   Lungs:  Clear throughout to auscultation.  No wheezes, crackles, or rhonchi.  Heart:  Regular rate and rhythm; no murmurs, clicks, rubs, or gallops. Abdomen:  Soft, non-distended.  BS present but quiet and hypoactive.  Does  not elicit much tenderness on exam.  Msk:  Symmetrical without gross deformities. Pulses:  Normal pulses noted. Extremities:  Without clubbing or edema. Neurologic:  Alert and  oriented x4;  grossly normal neurologically. Skin:  Intact without significant lesions or rashes. Psych:  Alert and cooperative. Normal mood and affect.   Lab Results:  Recent Labs  01/21/15 2110 01/22/15 0520  WBC 3.4* 3.4*  HGB 11.3* 10.2*  HCT 35.9* 33.4*  PLT 182 166   BMET  Recent Labs  01/21/15 2110 01/22/15 0520  NA 139 140  K 4.1 3.7  CL 105 106  CO2 27 24  GLUCOSE 92 87  BUN 6 6  CREATININE 0.86 0.83  CALCIUM 9.3 9.1   LFT  Recent Labs  01/21/15 2110  PROT 7.0  ALBUMIN 3.8  AST 30  ALT 28  ALKPHOS 94  BILITOT 0.6   PT/INR  Recent Labs  01/21/15 2110  LABPROT 14.9  INR 1.15   Studies/Results: Ct Abdomen Pelvis W Contrast  01/21/2015   CLINICAL DATA:  Follow-up pancreatitis.  Left upper quadrant pain.  EXAM: CT ABDOMEN AND PELVIS WITH CONTRAST  TECHNIQUE: Multidetector CT imaging of the abdomen and pelvis was performed using the standard protocol following bolus administration of intravenous contrast.  CONTRAST:  30mL  OMNIPAQUE IOHEXOL 300 MG/ML SOLN, ISOVUE-300 IOPAMIDOL (ISOVUE-300) INJECTION 61%  COMPARISON:  12/21/2014  FINDINGS: BODY WALL: No contributory findings.  LOWER CHEST: Mild mosaic attenuation the lower lungs compatible with air trapping.  ABDOMEN/PELVIS:  Liver: No focal abnormality.  Biliary: No evidence of biliary obstruction or stone.  Pancreas: The pancreas remains enlarged and indistinct consistent with pancreatitis. There is loculated fluid dissecting around the pancreas with patchy peripheral rim enhancement but no mature rind. There is a distinct 3 cm collection in the splenorenal ligament. A collection extending ventrally around the pancreatic body measures 6 x 4 x 3 cm. This collection extends posteriorly and inferiorly around the pancreas where there is an additional 3 cm collection. Thrombosis of the splenic vein which has developed since July, but is chronic based on extensive collateral formation already seen. Small reactive ascites is present. No evidence of pseudoaneurysm. Marked reactive thickening of the gastric wall with submucosal edema.  Spleen: New enlargement in the setting of splenic vein thrombosis.  Adrenals: Unremarkable.  Kidneys and ureters: No hydronephrosis or stone.  Bladder: Unremarkable.  Reproductive: No pathologic findings.  Bowel: Reactive gastritis with short gastric varices seen proximally. No bowel obstruction  Retroperitoneum: Reactive upper abdominal adenopathy.  Peritoneum: No ascites or pneumoperitoneum.  Vascular: No acute abnormality.  OSSEOUS: No acute abnormalities.  IMPRESSION: 1. Complicated pancreatitis with new loculated fluid collections measuring up to 6 x 4 x 3 cm. 2. Splenic vein occlusion is newly seen from July 2016 but chronic based on well-developed collaterals and splenomegaly. Short gastric varices are present. 3. Reactive gastritis.   Electronically Signed   By: Marnee Spring M.D.   On: 01/21/2015 14:54   IMPRESSION:  *19 year old male with  recurrent/persistent pancreatitis, idiopathic as no etiology has been determined this time despite extensive evaluation. *Splenic vein thrombosis:  Definitely new compared to imaging one month ago but already has formation of collaterals and short gastric varices.  Heparin gtt was started but now discontinued by primary service.  ? Secondary to pancreatitis/inflammation - likely PLAN: -Hematology consulted.  Spoke with Dr. Mosetta Putt who said she will see him today.  - Initially thought that the splenic vv thrombus was new and early but  it is new since last study but chronic w/ collaterals so anti-coagulation probably won't be helpful but wuill defer to hematology to be sure. Explained all this to dad.  TPN and bowel rest are a consideration but not proven to work here and cumbersome to say the least in this man's case and he is w/o pain. I think low grade fevers were inflammation and not infection so would not use abx.  Over time would do expectant f/u and then repeat CT likely in several weeks, sooner if clinically indicated.     ZEHR, JESSICA D.  01/22/2015, 8:49 AM  Pager number [phone]  [facility] Attending  I have also seen and assessed the patient and agree with the advanced practitioner's assessment and plan. CT images viewed and reviewed w/ father. Additions made also.  Iva Boop, MD, [facility] [phone] (pager) 01/22/2015 2:02 PM

## 2015-01-22 NOTE — Discharge Summary (Signed)
Physician Discharge Summary  Daryl Foster ZOX:[id number] DOB: 1995-07-11 DOA: 01/21/2015  PCP: Martha Clan, MD  Admit date: 01/21/2015 Discharge date: 01/22/2015  Time spent: 55 minutes  Recommendations for Outpatient Follow-up:  1. With GI and hematology to decide on when to repeat CT  Discharge Condition: stable Diet recommendation: low sodium heart healthy  Discharge Diagnoses:  Principal Problem:   Pseudocyst of pancreas Active Problems:   Splenic vein thrombosis   Abdominal pain, epigastric   Nausea without vomiting   Acute pancreatitis   History of present illness:  Daryl Foster is a 19 y.o. male with autism who was admitted from 7/22 through 8/2 for acute pancreatitis. Workup including a CT scan and an MRCP was unrevealing and it was thought that it was idiopathic. He was seen by his PCP for ongoing fevers, lipase was noted to be elevated, underwent CT of the abdomen and pelvis which revealed a loculated pancreatic fluid collection and splenic vein thrombosis with collaterals.   Hospital Course:  Principal Problem:  Pseudocyst of pancreas/ abd pain/ nausea and vomiting -Appreciate GI eval- recommended to continue diet as tolerate and repeat CT in "severeal weeks, sooner if clinically indicated" -has been using Vicodin at home with good response - Zofran PRN for nausea/ vomiting   Active Problems:  Splenic vein thrombosis -There are collaterals that have formed and therefore suspicion is this may be chronic- Dr Mosetta Putt (hematology) has assessed the patient and recommends Xarelto started pack and f/u CT  Procedures:  none (i.e. Studies not automatically included, echos, thoracentesis, etc; not x-rays)  Consultations:  GI- Dr Leone Payor  Hematology- Dr Mosetta Putt  Discharge Exam: [facility] Weights   01/21/15 1927  Weight: 108.274 kg (238 lb 11.2 oz)   Filed Vitals:   01/22/15 1451  BP: 133/67  Pulse: 70  Temp: 97.6 F (36.4 C)  Resp: 18    General: AAO x 3,  no distress Cardiovascular: RRR, no murmurs  Respiratory: clear to auscultation bilaterally GI: soft, non-tender, non-distended, bowel sound positive  Discharge Instructions You were cared for by a hospitalist during your hospital stay. If you have any questions about your discharge medications or the care you received while you were in the hospital after you are discharged, you can call the unit and asked to speak with the hospitalist on call if the hospitalist that took care of you is not available. Once you are discharged, your primary care physician will handle any further medical issues. Please note that NO REFILLS for any discharge medications will be authorized once you are discharged, as it is imperative that you return to your primary care physician (or establish a relationship with a primary care physician if you do not have one) for your aftercare needs so that they can reassess your need for medications and monitor your lab values.  Discharge Instructions    Discharge instructions    Complete by:  As directed   Low fat diet     Increase activity slowly    Complete by:  As directed             Medication List    TAKE these medications        acetaminophen 325 MG tablet  Commonly known as:  TYLENOL  Take 2 tablets (650 mg total) by mouth every 6 (six) hours as needed for fever.     HYDROcodone-acetaminophen 5-325 MG per tablet  Commonly known as:  NORCO/VICODIN  Take 1 tablet by mouth 8 days.  ondansetron 4 MG tablet  Commonly known as:  ZOFRAN  Take 1 tablet (4 mg total) by mouth 2 (two) times daily.     Rivaroxaban 15 & 20 MG Tbpk  Commonly known as:  XARELTO STARTER PACK  Take as directed on package: Start with one 15mg  tablet by mouth twice a day with food. On Day 22, switch to one 20mg  tablet once a day with food.       No Known Allergies    The results of significant diagnostics from this hospitalization (including imaging, microbiology, ancillary and  laboratory) are listed below for reference.    Significant Diagnostic Studies: Ct Abdomen Pelvis W Contrast  01/21/2015   CLINICAL DATA:  Follow-up pancreatitis.  Left upper quadrant pain.  EXAM: CT ABDOMEN AND PELVIS WITH CONTRAST  TECHNIQUE: Multidetector CT imaging of the abdomen and pelvis was performed using the standard protocol following bolus administration of intravenous contrast.  CONTRAST:  30mL OMNIPAQUE IOHEXOL 300 MG/ML SOLN, ISOVUE-300 IOPAMIDOL (ISOVUE-300) INJECTION 61%  COMPARISON:  12/21/2014  FINDINGS: BODY WALL: No contributory findings.  LOWER CHEST: Mild mosaic attenuation the lower lungs compatible with air trapping.  ABDOMEN/PELVIS:  Liver: No focal abnormality.  Biliary: No evidence of biliary obstruction or stone.  Pancreas: The pancreas remains enlarged and indistinct consistent with pancreatitis. There is loculated fluid dissecting around the pancreas with patchy peripheral rim enhancement but no mature rind. There is a distinct 3 cm collection in the splenorenal ligament. A collection extending ventrally around the pancreatic body measures 6 x 4 x 3 cm. This collection extends posteriorly and inferiorly around the pancreas where there is an additional 3 cm collection. Thrombosis of the splenic vein which has developed since July, but is chronic based on extensive collateral formation already seen. Small reactive ascites is present. No evidence of pseudoaneurysm. Marked reactive thickening of the gastric wall with submucosal edema.  Spleen: New enlargement in the setting of splenic vein thrombosis.  Adrenals: Unremarkable.  Kidneys and ureters: No hydronephrosis or stone.  Bladder: Unremarkable.  Reproductive: No pathologic findings.  Bowel: Reactive gastritis with short gastric varices seen proximally. No bowel obstruction  Retroperitoneum: Reactive upper abdominal adenopathy.  Peritoneum: No ascites or pneumoperitoneum.  Vascular: No acute abnormality.  OSSEOUS: No acute  abnormalities.  IMPRESSION: 1. Complicated pancreatitis with new loculated fluid collections measuring up to 6 x 4 x 3 cm. 2. Splenic vein occlusion is newly seen from July 2016 but chronic based on well-developed collaterals and splenomegaly. Short gastric varices are present. 3. Reactive gastritis.   Electronically Signed   By: Marnee Spring M.D.   On: 01/21/2015 14:54   Mr 3d Recon At Scanner  12/24/2014   CLINICAL DATA:  Acute pancreatitis unclear etiology.  EXAM: MRI ABDOMEN WITHOUT AND WITH CONTRAST (INCLUDING MRCP)  TECHNIQUE: Multiplanar multisequence MR imaging of the abdomen was performed both before and after the administration of intravenous contrast. Heavily T2-weighted images of the biliary and pancreatic ducts were obtained, and three-dimensional MRCP images were rendered by post processing.  CONTRAST:  20mL MULTIHANCE GADOBENATE DIMEGLUMINE 529 MG/ML IV SOLN  COMPARISON:  CT 12/21/2014  FINDINGS: Exam is suboptimal. Often patients with pancreatitis have difficulty breath holding for exam. Additionally this patient in had difficulty following breathing instructions.  Lower chest:  Lung bases are clear.  Hepatobiliary: No biliary duct dilatation. No focal hepatic lesion. No hepatic steatosis. Gallbladder is normal. There are no gallstones within the lumen of the gallbladder. The common bile duct is difficult to  follow but does not appear dilated.  Pancreas: There is extensive inflammation surrounding the pancreas head body and tail. Fluid along the anterior perirenal fascia. No organized fluid collections are identified. 1 small focus high intensity fluid measuring 12 mm x 10 mm ventral to the pancreatic head on image 53 of series 1400 suggests a small proteinaceous fluid collection. Pancreas enhances uniformly on the post-contrast exam. There is no obvious variant pancreatic ductal anatomy.  Spleen: Normal spleen. Splenic vein is patent. Portal veins are patent.  Adrenals/urinary tract: Adrenal  glands and kidneys are normal.  Stomach/Bowel: Stomach and limited of the small bowel is unremarkable  Vascular/Lymphatic: Abdominal aortic normal caliber. No retroperitoneal periportal lymphadenopathy.  Musculoskeletal: No aggressive osseous lesion  IMPRESSION: 1. No cholelithiasis.  Common bile duct appears normal caliber. 2. No clear variant pancreatic ductal anatomy. 3. Exam is somewhat suboptimal due to poor breath holding. 4. Findings consist with acute pancreatitis with pancreatic inflammation and retroperitoneal fluid. 5. No evidence of pancreatic necrosis.   Electronically Signed   By: Genevive Bi M.D.   On: 12/24/2014 20:03   Mr Abd W/wo Cm/mrcp  12/24/2014   CLINICAL DATA:  Acute pancreatitis unclear etiology.  EXAM: MRI ABDOMEN WITHOUT AND WITH CONTRAST (INCLUDING MRCP)  TECHNIQUE: Multiplanar multisequence MR imaging of the abdomen was performed both before and after the administration of intravenous contrast. Heavily T2-weighted images of the biliary and pancreatic ducts were obtained, and three-dimensional MRCP images were rendered by post processing.  CONTRAST:  20mL MULTIHANCE GADOBENATE DIMEGLUMINE 529 MG/ML IV SOLN  COMPARISON:  CT 12/21/2014  FINDINGS: Exam is suboptimal. Often patients with pancreatitis have difficulty breath holding for exam. Additionally this patient in had difficulty following breathing instructions.  Lower chest:  Lung bases are clear.  Hepatobiliary: No biliary duct dilatation. No focal hepatic lesion. No hepatic steatosis. Gallbladder is normal. There are no gallstones within the lumen of the gallbladder. The common bile duct is difficult to follow but does not appear dilated.  Pancreas: There is extensive inflammation surrounding the pancreas head body and tail. Fluid along the anterior perirenal fascia. No organized fluid collections are identified. 1 small focus high intensity fluid measuring 12 mm x 10 mm ventral to the pancreatic head on image 53 of series  1400 suggests a small proteinaceous fluid collection. Pancreas enhances uniformly on the post-contrast exam. There is no obvious variant pancreatic ductal anatomy.  Spleen: Normal spleen. Splenic vein is patent. Portal veins are patent.  Adrenals/urinary tract: Adrenal glands and kidneys are normal.  Stomach/Bowel: Stomach and limited of the small bowel is unremarkable  Vascular/Lymphatic: Abdominal aortic normal caliber. No retroperitoneal periportal lymphadenopathy.  Musculoskeletal: No aggressive osseous lesion  IMPRESSION: 1. No cholelithiasis.  Common bile duct appears normal caliber. 2. No clear variant pancreatic ductal anatomy. 3. Exam is somewhat suboptimal due to poor breath holding. 4. Findings consist with acute pancreatitis with pancreatic inflammation and retroperitoneal fluid. 5. No evidence of pancreatic necrosis.   Electronically Signed   By: Genevive Bi M.D.   On: 12/24/2014 20:03    Microbiology: No results found for this or any previous visit (from the past 240 hour(s)).   Labs: Basic Metabolic Panel:  Recent Labs Lab 01/21/15 2110 01/22/15 0520  NA 139 140  K 4.1 3.7  CL 105 106  CO2 27 24  GLUCOSE 92 87  BUN 6 6  CREATININE 0.86 0.83  CALCIUM 9.3 9.1   Liver Function Tests:  Recent Labs Lab 01/21/15 2110  AST 30  ALT 28  ALKPHOS 94  BILITOT 0.6  PROT 7.0  ALBUMIN 3.8    Recent Labs Lab 01/21/15 2110 01/22/15 0558  LIPASE 131* 114*   No results for input(s): AMMONIA in the last 168 hours. CBC:  Recent Labs Lab 01/21/15 2110 01/22/15 0520  WBC 3.4* 3.4*  NEUTROABS 1.7  --   HGB 11.3* 10.2*  HCT 35.9* 33.4*  MCV 81.6 82.1  PLT 182 166   Cardiac Enzymes: No results for input(s): CKTOTAL, CKMB, CKMBINDEX, TROPONINI in the last 168 hours. BNP: BNP (last 3 results) No results for input(s): BNP in the last 8760 hours.  ProBNP (last 3 results) No results for input(s): PROBNP in the last 8760 hours.  CBG: No results for input(s):  GLUCAP in the last 168 hours.     SignedCalvert Cantor, MD [facility] 01/22/2015, 3:56 PM

## 2015-01-22 NOTE — Progress Notes (Signed)
ANTICOAGULATION CONSULT NOTE - Initial Consult  Pharmacy Consult for IV Heparin Indication: Splenic Vein Thrombosis  No Known Allergies  Patient Measurements: Height:  (175.3 cm) Weight: 238 lb 11.2 oz (108.274 kg) IBW/kg (Calculated) : 70.7 Heparin Dosing Weight: 94 kg  Vital Signs: Temp: 97.8 F (36.6 C) (08/27 0459) Temp Source: Oral (08/27 0459) BP: 119/61 mmHg (08/27 0459) Pulse Rate: 77 (08/27 0459)  Labs:  Recent Labs  01/21/15 2110 01/22/15 0520  HGB 11.3* 10.2*  HCT 35.9* 33.4*  PLT 182 166  APTT 30  --   LABPROT 14.9  --   INR 1.15  --   HEPARINUNFRC  --  0.36  CREATININE 0.86 0.83   Estimated Creatinine Clearance: 170.7 mL/min (by C-G formula based on Cr of 0.83).  Medical History: Past Medical History  Diagnosis Date  . Autism   . 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  CT on admission shows complicated pancreatitis with new loculated fluid collections and splenic vein occlusion newly seen from July 2016.  Pharmacy consulted to begin treatment with IV heparin.  Baseline coag levels wnl, H/H ~ low, Plt wnl  Heparin 2700 unit bolus, infusion begun at 1800 units/hr @ 2200  First Heparin level in range at 0.36 units/ml  Goal of Therapy:  Heparin level 0.3-0.7 units/ml Monitor platelets by anticoagulation protocol: Yes   Plan:   Continue Heparin at 1800 units/hr  Recheck level at 12N  Daily CBC ordered, plan daily Heparin level when levels remain in range  Otho Bellows PharmD Pager [phone] 01/22/2015, 7:12 AM

## 2015-02-08 ENCOUNTER — Ambulatory Visit (INDEPENDENT_AMBULATORY_CARE_PROVIDER_SITE_OTHER): Payer: 59 | Admitting: Gastroenterology

## 2015-02-08 ENCOUNTER — Other Ambulatory Visit (INDEPENDENT_AMBULATORY_CARE_PROVIDER_SITE_OTHER): Payer: 59

## 2015-02-08 ENCOUNTER — Encounter: Payer: Self-pay | Admitting: Gastroenterology

## 2015-02-08 VITALS — BP 110/60 | HR 70 | Temp 98.6°F | Ht 69.0 in | Wt 233.0 lb

## 2015-02-08 DIAGNOSIS — K859 Acute pancreatitis, unspecified: Secondary | ICD-10-CM

## 2015-02-08 DIAGNOSIS — R932 Abnormal findings on diagnostic imaging of liver and biliary tract: Secondary | ICD-10-CM

## 2015-02-08 LAB — COMPREHENSIVE METABOLIC PANEL
ALBUMIN: 4.1 g/dL (ref 3.5–5.2)
ALT: 24 U/L (ref 0–53)
AST: 27 U/L (ref 0–37)
Alkaline Phosphatase: 124 U/L — ABNORMAL HIGH (ref 39–117)
BUN: 7 mg/dL (ref 6–23)
CHLORIDE: 104 meq/L (ref 96–112)
CO2: 26 meq/L (ref 19–32)
CREATININE: 0.82 mg/dL (ref 0.40–1.50)
Calcium: 9.4 mg/dL (ref 8.4–10.5)
GFR: 125.57 mL/min (ref >60.00)
Glucose, Bld: 95 mg/dL (ref 70–99)
Potassium: 4.1 mEq/L (ref 3.5–5.1)
SODIUM: 138 meq/L (ref 135–145)
Total Bilirubin: 0.7 mg/dL (ref 0.2–1.2)
Total Protein: 7.1 g/dL (ref 6.0–8.3)

## 2015-02-08 LAB — CBC WITH DIFFERENTIAL/PLATELET
BASOS PCT: 0.9 % (ref 0.0–3.0)
Basophils Absolute: 0 10*3/uL (ref 0.0–0.1)
EOS ABS: 0.5 10*3/uL (ref 0.0–0.7)
Eosinophils Relative: 9.6 % — ABNORMAL HIGH (ref 0.0–5.0)
HCT: 36.9 % — ABNORMAL LOW (ref 39.0–52.0)
Hemoglobin: 11.8 g/dL — ABNORMAL LOW (ref 13.0–17.0)
Lymphocytes Relative: 25.2 % (ref 12.0–46.0)
Lymphs Abs: 1.3 10*3/uL (ref 0.7–4.0)
MCHC: 32 g/dL (ref 30.0–36.0)
MCV: 77.7 fl — ABNORMAL LOW (ref 78.0–100.0)
MONO ABS: 0.4 10*3/uL (ref 0.1–1.0)
Monocytes Relative: 7.7 % (ref 3.0–12.0)
NEUTROS ABS: 2.9 10*3/uL (ref 1.4–7.7)
NEUTROS PCT: 56.6 % (ref 43.0–77.0)
PLATELETS: 225 10*3/uL (ref 150.0–400.0)
RBC: 4.75 Mil/uL (ref 4.22–5.81)
RDW: 14.4 % (ref 11.5–15.5)
WBC: 5.2 10*3/uL (ref 4.0–10.5)

## 2015-02-08 LAB — LIPASE: LIPASE: 428 U/L — AB (ref 11.0–59.0)

## 2015-02-08 NOTE — Progress Notes (Signed)
    History of Present Illness: This is a 19 year old male returning for follow-up of pancreatitis. He is accompanied by his mother and father. Records from hospitalization on 8/26 thru 8/27 were reviewed. He relates minimal abdominal pain on August 28 and no pain since that time. The first 2 days of September his mother reports fevers to 100.5 and 100.8. For over one week he has not had any fevers. He continues on Xarelto for splenic vein thrombosis. He is tolerating a low fat diet and has had a 15 pound weight loss since his visit to our office on August 5. He feels well and has no complaints at all today.  CT IMPRESSION from 8/26: 1. Complicated pancreatitis with new loculated fluid collections measuring up to 6 x 4 x 3 cm. 2. Splenic vein occlusion is newly seen from July 2016 but chronic based on well-developed collaterals and splenomegaly. Short gastric varices are present. 3. Reactive gastritis.  Current Medications, Allergies, Past Medical History, Past Surgical History, Family History and Social History were reviewed in [employer] record.  Physical Exam: General: Well developed , well nourished, no acute distress Head: Normocephalic and atraumatic Eyes:  sclerae anicteric, EOMI Ears: Normal auditory acuity Mouth: No deformity or lesions Lungs: Clear throughout to auscultation Heart: Regular rate and rhythm; no murmurs, rubs or bruits Abdomen: Soft, non tender and non distended. No masses, hepatosplenomegaly or hernias noted. Normal Bowel sounds Musculoskeletal: Symmetrical with no gross deformities  Pulses:  Normal pulses noted Extremities: No clubbing, cyanosis, edema or deformities noted Neurological: Alert oriented x 4, grossly nonfocal Psychological:  Alert and cooperative. Normal mood and affect  Assessment and Recommendations:  1. Acute pancreatitis, etiology unclear and clinically resolving. Abnormal pancreatic body fluid collection related to  pancreatitis, possibly developing pseudocyst. CMP, CBC and lipase today. Continue low fat diet long-term. Repeat CT scan of the pancreas at weeks from prior CT. I answered all questions from the patient and his parents to their satisfaction.  2. Subacute splenic vein thrombosis likely related to pancreatitis. Currently on Xarelto. This will be evaluated at CT scan. Initial recommendation by Dr. Malachy Mood was for 6 months of Xarelto. Mgmt plans per Dr. Eric Form and Dr. Malachy Mood.

## 2015-02-08 NOTE — Patient Instructions (Addendum)
Your physician has requested that you go to the basement for the following lab work before leaving today:CBC, Cmet, and Lipase.  You have been scheduled for a CT scan of the abdomen and pelvis at [facility] ([address]  You are scheduled on 03/24/15 at 2:00pm. You should arrive at 12:30pm  prior to your appointment time for registration and to drink oral contrast. Please follow the written instructions below on the day of your exam:  WARNING: IF YOU ARE ALLERGIC TO IODINE/X-RAY DYE, PLEASE NOTIFY RADIOLOGY IMMEDIATELY AT [phone]! YOU WILL BE GIVEN A 13 HOUR PREMEDICATION PREP.  Do not eat or drink anything after 10:00am (4 hours prior to your test)  You may take any medications as prescribed with a small amount of water except for the following: Metformin, Glucophage, Glucovance, Avandamet, Riomet, Fortamet, Actoplus Met, Janumet, Glumetza or Metaglip. The above medications must be held the day of the exam AND 48 hours after the exam.  The purpose of you drinking the oral contrast is to aid in the visualization of your intestinal tract. The contrast solution may cause some diarrhea. Before your exam is started, you will be given a small amount of fluid to drink. Depending on your individual set of symptoms, you may also receive an intravenous injection of x-ray contrast/dye. Plan on being at [facility] for 30 minutes or long, depending on the type of exam you are having performed.  This test typically takes 30-45 minutes to complete.  ________________________________________________________________  Thank you for choosing me and [facility].  Pricilla Riffle. Dagoberto Ligas., MD., Marval Regal  cc: Marton Redwood, MD

## 2015-02-09 ENCOUNTER — Other Ambulatory Visit: Payer: Self-pay

## 2015-02-09 DIAGNOSIS — R748 Abnormal levels of other serum enzymes: Secondary | ICD-10-CM

## 2015-02-09 DIAGNOSIS — D649 Anemia, unspecified: Secondary | ICD-10-CM

## 2015-02-14 ENCOUNTER — Telehealth: Payer: Self-pay | Admitting: *Deleted

## 2015-02-14 NOTE — Telephone Encounter (Signed)
Patient's father called "questioning Xarelto.  I don't believe this was ordered correctly and I need to know how to get a refill.  He completed 21 days of 15 mg and now only has ten of the 20 mg.  We use [facility] [address]  He's supposed to take this for the next six months.  He's for another CT scan 03-24-2015 so I'll have Dr. Fuller Plan forward this result to Dr. Burr Medico.  "    Advised Xarelto on hand is correct for this starter kit.  He can contact Dr. Lynne Leader office for refills.  Dr. Ernestina Penna hospital consult note has all the information needed on this medication.  He is to F/U at [facility] on an as needed basis.

## 2015-02-15 ENCOUNTER — Other Ambulatory Visit (INDEPENDENT_AMBULATORY_CARE_PROVIDER_SITE_OTHER): Payer: 59

## 2015-02-15 ENCOUNTER — Other Ambulatory Visit: Payer: Self-pay

## 2015-02-15 DIAGNOSIS — R748 Abnormal levels of other serum enzymes: Secondary | ICD-10-CM

## 2015-02-15 DIAGNOSIS — D649 Anemia, unspecified: Secondary | ICD-10-CM | POA: Diagnosis not present

## 2015-02-15 DIAGNOSIS — D509 Iron deficiency anemia, unspecified: Secondary | ICD-10-CM

## 2015-02-15 LAB — LIPASE: LIPASE: 242 U/L — AB (ref 11.0–59.0)

## 2015-02-15 LAB — IBC PANEL
Iron: 62 ug/dL (ref 42–165)
SATURATION RATIOS: 19.4 % — AB (ref 20.0–50.0)
TRANSFERRIN: 228 mg/dL (ref 212.0–360.0)

## 2015-02-15 NOTE — Telephone Encounter (Signed)
Hematologist or Dr. Clelia Croft will need to manage anticoagulation.

## 2015-02-17 NOTE — Telephone Encounter (Signed)
Routed to GI to facilitate refill.  Here's response from GI for Hematology or Dr. Martha Clan to refill Xarelto.

## 2015-02-17 NOTE — Telephone Encounter (Signed)
I will be happy to see him back, will send a POF to schedule. Please let pt's father know that and pt should continue xarelto  daily until I see him. OK to refill if he needs it.   Malachy Mood  02/17/2015

## 2015-02-17 NOTE — Telephone Encounter (Signed)
1615 called patient's father with this information.  "I got the refill from Dr. Clelia Croft.  I do not think he needs F/U at this time.  The other doctors on this case will be in touch with her as needed at this time.

## 2015-03-24 ENCOUNTER — Ambulatory Visit
Admission: RE | Admit: 2015-03-24 | Discharge: 2015-03-24 | Disposition: A | Discharge status: Home / Self Care | Payer: 59 | Source: Ambulatory Visit | Attending: Gastroenterology | Admitting: Gastroenterology

## 2015-03-24 DIAGNOSIS — K859 Acute pancreatitis, unspecified: Secondary | ICD-10-CM

## 2015-03-24 DIAGNOSIS — R932 Abnormal findings on diagnostic imaging of liver and biliary tract: Secondary | ICD-10-CM

## 2015-03-24 MED ORDER — IOHEXOL 300 MG/ML  SOLN
30.0000 mL | Freq: Once | INTRAMUSCULAR | Status: AC | PRN
  Administered 2015-03-24: 30 mL via ORAL

## 2015-03-24 MED ORDER — IOPAMIDOL (ISOVUE-300) INJECTION 61%
125.0000 mL | Freq: Once | INTRAVENOUS | Status: AC | PRN
  Administered 2015-03-24: 125 mL via INTRAVENOUS

## 2015-03-25 ENCOUNTER — Telehealth: Payer: Self-pay | Admitting: *Deleted

## 2015-03-25 ENCOUNTER — Other Ambulatory Visit: Payer: Self-pay | Admitting: Hematology

## 2015-03-25 NOTE — Telephone Encounter (Signed)
I called pt's father back. I offered them a followed up appointment one month ago and he did not want to come. He called today to have my opinion about his son's splenic vein thrombosis and anticoagulation. I reviewed CT scan from yesterday with him over the phone, his splenic vein thrombosis with collateralization, it has been chronic, unchanged, he does have splenomegaly. I think the benefit of Xarelto for this chronic thrombosis is less certain, and I recommend him can continue Xarelto for 1-4 more months, and consider stop it afterwards. He will continue follow up with his PCP Dr. Clelia CroftShaw.   Malachy MoodFeng, Rolando Hessling  03/25/2015

## 2015-03-25 NOTE — Telephone Encounter (Signed)
Patient's father Daryl BondRichard Shenberger called.  "Dr. Mosetta PuttFeng saw my son in the hospital in August.  Has Dr. Russella DarStark sent her results of the 03-24-2015 CT Abdomen Pelvis?  After she reviews results could she please call me at [phone] to discuss.  Thank you."

## 2015-06-27 ENCOUNTER — Telehealth: Payer: Self-pay

## 2015-06-27 NOTE — Telephone Encounter (Signed)
-----   Message from Massa Pe L Santana Edell, RN sent at 03/25/2015  9:11 AM EDT -----   ----- Message -----    From: Malcolm T Stark, MD    Sent: 03/24/2015   5:20 PM      To: Aliayah Tyer L Yisroel Mullendore, RN  Yes, I sent you a result note with 3 month recommendation.  ----- Message -----    From: Havish Petties L Vernor Monnig, RN    Sent: 03/24/2015   5:01 PM      To: Malcolm T Stark, MD  Needs ct in 3 months- see results from 03/24/15  

## 2015-06-27 NOTE — Telephone Encounter (Signed)
-----   Message from Annett Fabian, RN sent at 03/25/2015  9:11 AM EDT -----   ----- Message -----    From: Meryl Dare, MD    Sent: 03/24/2015   5:20 PM      To: Annett Fabian, RN  Yes, I sent you a result note with 3 month recommendation.  ----- Message -----    From: Annett Fabian, RN    Sent: 03/24/2015   5:01 PM      To: Meryl Dare, MD  Needs ct in 3 months- see results from 03/24/15

## 2015-06-27 NOTE — Telephone Encounter (Signed)
Father declining to schedule patients recommended abdominal CT scan for follow up of a pancreatic cyst, presumed pseudocyst.

## 2015-06-27 NOTE — Telephone Encounter (Signed)
I contacted the father to schedule follow up CT scan. Father declines to schedule.  "I discussed with Dr. Clelia Croft and he is fine, we are not going forward with the CT".  He will call back if he changes his mind.

## 2017-01-17 ENCOUNTER — Encounter (HOSPITAL_COMMUNITY): Payer: Self-pay

## 2017-01-17 ENCOUNTER — Emergency Department (HOSPITAL_COMMUNITY): Payer: BLUE CROSS/BLUE SHIELD

## 2017-01-17 ENCOUNTER — Emergency Department (HOSPITAL_COMMUNITY)
Admission: EM | Admit: 2017-01-17 | Discharge: 2017-01-17 | Disposition: A | Discharge status: Home / Self Care | Payer: BLUE CROSS/BLUE SHIELD | Attending: Emergency Medicine | Admitting: Emergency Medicine

## 2017-01-17 DIAGNOSIS — R739 Hyperglycemia, unspecified: Secondary | ICD-10-CM | POA: Diagnosis not present

## 2017-01-17 DIAGNOSIS — R1011 Right upper quadrant pain: Secondary | ICD-10-CM | POA: Insufficient documentation

## 2017-01-17 DIAGNOSIS — F840 Autistic disorder: Secondary | ICD-10-CM | POA: Diagnosis not present

## 2017-01-17 DIAGNOSIS — R112 Nausea with vomiting, unspecified: Secondary | ICD-10-CM | POA: Diagnosis not present

## 2017-01-17 DIAGNOSIS — R1013 Epigastric pain: Secondary | ICD-10-CM | POA: Diagnosis present

## 2017-01-17 LAB — COMPREHENSIVE METABOLIC PANEL
ALK PHOS: 84 U/L (ref 38–126)
ALT: 43 U/L (ref 17–63)
AST: 39 U/L (ref 15–41)
Albumin: 4.8 g/dL (ref 3.5–5.0)
Anion gap: 16 — ABNORMAL HIGH (ref 5–15)
BILIRUBIN TOTAL: 2.1 mg/dL — AB (ref 0.3–1.2)
BUN: 17 mg/dL (ref 6–20)
CALCIUM: 10 mg/dL (ref 8.9–10.3)
CHLORIDE: 103 mmol/L (ref 101–111)
CO2: 21 mmol/L — ABNORMAL LOW (ref 22–32)
CREATININE: 1.2 mg/dL (ref 0.61–1.24)
GFR calc Af Amer: >60 mL/min (ref >60)
Glucose, Bld: 236 mg/dL — ABNORMAL HIGH (ref 65–99)
Potassium: 3.4 mmol/L — ABNORMAL LOW (ref 3.5–5.1)
Sodium: 140 mmol/L (ref 135–145)
Total Protein: 7.8 g/dL (ref 6.5–8.1)

## 2017-01-17 LAB — CBG MONITORING, ED: GLUCOSE-CAPILLARY: 130 mg/dL — AB (ref 65–99)

## 2017-01-17 LAB — CBC WITH DIFFERENTIAL/PLATELET
BASOS ABS: 0 10*3/uL (ref 0.0–0.1)
Basophils Relative: 0 %
Eosinophils Absolute: 0 10*3/uL (ref 0.0–0.7)
Eosinophils Relative: 0 %
HCT: 46.4 % (ref 39.0–52.0)
Hemoglobin: 15.2 g/dL (ref 13.0–17.0)
LYMPHS ABS: 1.6 10*3/uL (ref 0.7–4.0)
Lymphocytes Relative: 10 %
MCH: 25.8 pg — ABNORMAL LOW (ref 26.0–34.0)
MCHC: 32.8 g/dL (ref 30.0–36.0)
MCV: 78.6 fL (ref 78.0–100.0)
MONOS PCT: 2 %
Monocytes Absolute: 0.3 10*3/uL (ref 0.1–1.0)
NEUTROS ABS: 13.9 10*3/uL — AB (ref 1.7–7.7)
Neutrophils Relative %: 88 %
PLATELETS: 185 10*3/uL (ref 150–400)
RBC: 5.9 MIL/uL — ABNORMAL HIGH (ref 4.22–5.81)
RDW: 13 % (ref 11.5–15.5)
WBC: 15.8 10*3/uL — ABNORMAL HIGH (ref 4.0–10.5)

## 2017-01-17 LAB — BLOOD GAS, VENOUS
Acid-Base Excess: 4.8 mmol/L — ABNORMAL HIGH (ref 0.0–2.0)
Bicarbonate: 27.7 mmol/L (ref 20.0–28.0)
O2 Saturation: 60.9 %
PATIENT TEMPERATURE: 37
PH VEN: 7.494 — AB (ref 7.250–7.430)
PO2 VEN: 31.4 mmHg — AB (ref 32.0–45.0)
pCO2, Ven: 36.4 mmHg — ABNORMAL LOW (ref 44.0–60.0)

## 2017-01-17 LAB — LIPASE, BLOOD: LIPASE: 20 U/L (ref 11–51)

## 2017-01-17 MED ORDER — IOPAMIDOL (ISOVUE-300) INJECTION 61%
INTRAVENOUS | Status: AC
  Filled 2017-01-17: qty 100

## 2017-01-17 MED ORDER — SODIUM CHLORIDE 0.9 % IV BOLUS (SEPSIS)
1000.0000 mL | Freq: Once | INTRAVENOUS | Status: AC
  Administered 2017-01-17: 1000 mL via INTRAVENOUS

## 2017-01-17 MED ORDER — ONDANSETRON 4 MG PO TBDP: 4.0000 mg | ORAL_TABLET | Freq: Three times a day (TID) | ORAL | 0 refills | Status: AC | PRN

## 2017-01-17 MED ORDER — TRAMADOL HCL 50 MG PO TABS: 50.0000 mg | ORAL_TABLET | Freq: Four times a day (QID) | ORAL | 0 refills | Status: DC | PRN

## 2017-01-17 MED ORDER — ONDANSETRON HCL 4 MG/2ML IJ SOLN
4.0000 mg | Freq: Once | INTRAMUSCULAR | Status: AC
  Administered 2017-01-17: 4 mg via INTRAVENOUS
  Filled 2017-01-17: qty 2

## 2017-01-17 MED ORDER — MORPHINE SULFATE (PF) 2 MG/ML IV SOLN
4.0000 mg | Freq: Once | INTRAVENOUS | Status: AC
  Administered 2017-01-17: 4 mg via INTRAVENOUS
  Filled 2017-01-17: qty 2

## 2017-01-17 MED ORDER — IOPAMIDOL (ISOVUE-300) INJECTION 61%
100.0000 mL | Freq: Once | INTRAVENOUS | Status: AC | PRN
  Administered 2017-01-17: 100 mL via INTRAVENOUS

## 2017-01-17 NOTE — Discharge Instructions (Signed)
You have been seen in the Emergency Department (ED) for abdominal pain.  Your evaluation did not identify a clear cause of your symptoms but was generally reassuring.  You do have high blood sugar here and will need to follow up with your PCP in the coming days for this issue as well.   Please follow up as instructed above regarding today?s emergent visit and the symptoms that are bothering you.  Return to the ED if your abdominal pain worsens or fails to improve, you develop bloody vomiting, bloody diarrhea, you are unable to tolerate fluids due to vomiting, fever greater than 101, or other symptoms that concern you.

## 2017-01-17 NOTE — ED Notes (Signed)
Bed: WA08 Expected date:  Expected time:  Means of arrival:  Comments: Hold for triage 2 

## 2017-01-17 NOTE — ED Triage Notes (Signed)
Pt seen at PCP this morning, directed to the ED for possible pancreatic issues. Pt states pain started yesterday morning, similar to previous pancreatitis episode in December 17, 2014.  Patient is experiencing nausea and vomiting. BP and Pulse slightly elevated.

## 2017-01-17 NOTE — ED Notes (Signed)
Coming form PCP office-sending to ED for possible pancreatitis-has a history of the same-states elevated FFTs and WBC

## 2017-01-17 NOTE — ED Notes (Addendum)
Pt states that he has been abdominal pain to right upper quad with associated N/V, x 1 day denies diarrhea. Pt father is at bedside.

## 2017-01-17 NOTE — ED Provider Notes (Signed)
Emergency Department Provider Note   I have reviewed the triage vital signs and the nursing notes.   HISTORY  Chief Complaint Abdominal Pain   HPI Daryl Foster is a 21 y.o. male with PMH of autism and pancreatitis with prior pseudocyst (2016) presents emergency department for evaluation of epigastric abdominal pain with nausea and vomiting. Symptoms began last night. Family denies any fever or shaking chills. Patient denies chest pain or difficulty breathing. He has had pancreatitis in the past and was hospitalized in 2016 with pancreatitis complications including pseudocyst. He saw his primary care physician this morning due to blood work and referred to the emergency department for further evaluation. No new medications. Patient had one episode of vomiting in the emergency department waiting room.  Level 5 caveat: Autism limiting HPI and ROS.    Past Medical History:  Diagnosis Date  . Autism   . 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 Pancreatitis     Patient Active Problem List   Diagnosis Date Noted  . Splenic vein thrombosis 01/21/2015  . Pseudocyst of pancreas 01/21/2015  . Abdominal pain, epigastric 01/21/2015  . Nausea without vomiting 01/21/2015  . Pancreatitis 12/17/2014    History reviewed. No pertinent surgical history.  Current Outpatient Rx  . Order #: [id number] Class: Historical Med  . Order #: [id number] Class: Print  . Order #: [id number] Class: Historical Med  . Order #: [id number] Class: Print  . Order #: [id number] Class: Print  . Order #: [id number] Class: Normal  . Order #: [id number] Class: Print    Allergies Patient has no known allergies.  Family History  Problem Relation Age of Onset  . Adopted: Yes  . Asthma Father   . Lupus Mother        Biological Mother    Social History Social History  Substance Use Topics  . Smoking status: Never Smoker  . Smokeless tobacco: Never Used  . Alcohol use No    Review of  Systems  Constitutional: No fever/chills Eyes: No visual changes. ENT: No sore throat. Cardiovascular: Denies chest pain. Respiratory: Denies shortness of breath. Gastrointestinal: Epigastric abdominal pain. Positive nausea and vomiting.  No diarrhea.  No constipation.  10-point ROS otherwise negative.  Level 5 caveat: Autism.   ____________________________________________   PHYSICAL EXAM:  VITAL SIGNS: ED Triage Vitals  Enc Vitals Group     BP 01/17/17 1051 (!) 142/81     Pulse Rate 01/17/17 1051 (!) 115     Resp 01/17/17 1051 18     Temp 01/17/17 1051 97.8 F (36.6 C)     Temp Source 01/17/17 1051 Oral     SpO2 01/17/17 1051 100 %     Weight 01/17/17 1057 250 lb (113.4 kg)     Height 01/17/17 1057 5\' 9"  (1.753 m)     Pain Score 01/17/17 1050 10   Constitutional: Alert. Appears uncomfortable with no acute distress.  Eyes: Conjunctivae are normal. Head: Atraumatic. Nose: No congestion/rhinnorhea. Mouth/Throat: Mucous membranes are slightly dry.   Neck: No stridor. Cardiovascular: Tachycardia. Good peripheral circulation. Grossly normal heart sounds.   Respiratory: Normal respiratory effort.  No retractions. Lungs CTAB. Gastrointestinal: Soft with positive epigastric abdominal pain. No distention.  Musculoskeletal: No lower extremity tenderness nor edema. No gross deformities of extremities. Neurologic:  Normal speech and language. No gross focal neurologic deficits are appreciated.  Skin:  Skin is warm, dry and intact. No rash noted.  ____________________________________________   LABS (all labs ordered are listed, but only abnormal results are displayed)  Labs Reviewed  COMPREHENSIVE METABOLIC PANEL - Abnormal; Notable for the following:       Result Value   Potassium 3.4 (*)    CO2 21 (*)    Glucose, Bld 236 (*)    Total Bilirubin 2.1 (*)    Anion gap 16 (*)    All other components within normal limits  CBC WITH DIFFERENTIAL/PLATELET - Abnormal; Notable  for the following:    WBC 15.8 (*)    RBC 5.90 (*)    MCH 25.8 (*)    Neutro Abs 13.9 (*)    All other components within normal limits  BLOOD GAS, VENOUS - Abnormal; Notable for the following:    pH, Ven 7.494 (*)    pCO2, Ven 36.4 (*)    pO2, Ven 31.4 (*)    Acid-Base Excess 4.8 (*)    All other components within normal limits  CBG MONITORING, ED - Abnormal; Notable for the following:    Glucose-Capillary 130 (*)    All other components within normal limits  LIPASE, BLOOD   ____________________________________________  RADIOLOGY  Ct Abdomen Pelvis W Contrast  Result Date: 01/17/2017 CLINICAL DATA:  Right upper quadrant abdominal pain with nausea and vomiting for 1 day. EXAM: CT ABDOMEN AND PELVIS WITH CONTRAST TECHNIQUE: Multidetector CT imaging of the abdomen and pelvis was performed using the standard protocol following bolus administration of intravenous contrast. CONTRAST:  ISOVUE-300 IOPAMIDOL (ISOVUE-300) INJECTION 61% COMPARISON:  03/24/2015 FINDINGS: Lower chest: Borderline lower esophageal wall thickening without fat inflammation, not convincing for esophagitis. Hepatobiliary: No focal liver abnormality.No evidence of biliary obstruction or stone. Pancreas: History of pancreatitis with ventral peripancreatic soft tissue density and architectural distortion in an area where there was previously documented pseudocysts. This soft tissue area measures approximately 17 mm today. No indication of acute pancreatitis. No fluid collection or ductal obstruction. Spleen: Chronic mild enlargement. Adrenals/Urinary Tract: Negative adrenals. No hydronephrosis or stone. Unremarkable bladder. Stomach/Bowel: No evidence of inflammation. Prominent gastric antral wall thickness attributed to lumen collapse. No regional fat edema. Negative appendix. Vascular/Lymphatic: Chronic splenic vein occlusion with collaterals including short gastric varices. No acute vascular finding. No mass or adenopathy.  Reproductive:No pathologic findings. Other: No ascites or pneumoperitoneum. Musculoskeletal: No acute abnormalities. Motion degraded, best obtainable as noted in technologist report. IMPRESSION: 1. No acute finding to explain right upper quadrant pain. 2. Sequela of pancreatitis including chronic splenic vein occlusion with collaterals, including short gastric varices. Electronically Signed   By: Marnee Spring M.D.   On: 01/17/2017 16:12   US Abdomen Limited Ruq  Result Date: 01/17/2017 CLINICAL DATA:  Right upper quadrant pain for 2 days. EXAM: ULTRASOUND ABDOMEN LIMITED RIGHT UPPER QUADRANT COMPARISON:  03/24/2015 CT FINDINGS: Gallbladder: No gallstones or wall thickening visualized. No sonographic Murphy sign noted by sonographer. Common bile duct: Diameter: 3 mm. Not well visualized secondary to overlying bowel gas. Liver: Increased echogenicity, suggesting hepatic steatosis. Portal vein is patent on color Doppler imaging with normal direction of blood flow towards the liver. IMPRESSION: 1. Hepatic steatosis. 2.  No acute process or explanation for right upper quadrant pain. 3. Suboptimal visualization of the common duct. Electronically Signed   By: Jeronimo Greaves M.D.   On: 01/17/2017 15:10    ____________________________________________   PROCEDURES  Procedure(s) performed:   Procedures  None ____________________________________________   INITIAL IMPRESSION / ASSESSMENT AND PLAN / ED COURSE  Pertinent labs & imaging results that were available during my care of the patient were reviewed by me and  considered in my medical decision making (see chart for details).  Patient presents to the emergency room in for evaluation of epigastric abdominal pain with nausea and vomiting. He went to the primary care physician's office and had blood work this morning which suggested acute pancreatitis. Has history of complicated pancreatitis in 2016. On lab work from PCP he has a T Bili or 2.0 and  slight elevation in liver enzymes. Plan for RUQ [place] and reassess after pain/nausea medication, IVF, and repeat labs.   03:19 PM No acute findings on US of the abdomen. Plan for CT for further evaluation given history of pancreatitis with prior complications. Normal lipase.   04:31 PM Patient with no acute findings on CT. Labs consistent with more of a viral GI infection. Does have elevated BS here with anion gap of 16. With patient improving significantly with IVF and nausea/pain medication I doubt that this is developing DKA. Discussed observational admission vs discharge with parents. They feel that he would do better at home overall and so would prefer this option. Discussed return for worsening nausea and vomiting.  Anticipate discharge pending VBG results.   I have reviewed and discussed all results (EKG, imaging, lab, urine as appropriate), exam findings with patient. I have reviewed nursing notes and appropriate previous records.  I feel the patient is safe to be discharged home without further emergent workup. Discussed usual and customary return precautions. Patient and family (if present) verbalize understanding and are comfortable with this plan.  Patient will follow-up with their primary care provider. If they do not have a primary care provider, information for follow-up has been provided to them. All questions have been answered.   ____________________________________________  FINAL CLINICAL IMPRESSION(S) / ED DIAGNOSES  Final diagnoses:  RUQ abdominal pain  Non-intractable vomiting with nausea, unspecified vomiting type  Hyperglycemia     MEDICATIONS GIVEN DURING THIS VISIT:  Medications  iopamidol (ISOVUE-300) 61 % injection (not administered)  sodium chloride 0.9 % bolus 1,000 mL (0 mLs Intravenous Stopped 01/17/17 1840)  ondansetron (ZOFRAN) injection 4 mg (4 mg Intravenous Given 01/17/17 1236)  morphine 2 MG/ML injection 4 mg (4 mg Intravenous Given 01/17/17 1235)    iopamidol (ISOVUE-300) 61 % injection 100 mL (100 mLs Intravenous Contrast Given 01/17/17 1548)     NEW OUTPATIENT MEDICATIONS STARTED DURING THIS VISIT:  Discharge Medication List as of 01/17/2017  6:22 PM    START taking these medications   Details  ondansetron (ZOFRAN ODT) 4 MG disintegrating tablet Take 1 tablet (4 mg total) by mouth every 8 (eight) hours as needed for nausea or vomiting., Starting Thu 01/17/2017, Print    traMADol (ULTRAM) 50 MG tablet Take 1 tablet (50 mg total) by mouth every 6 (six) hours as needed., Starting Thu 01/17/2017, Print          Note:  This document was prepared using Dragon voice recognition software and may include unintentional dictation errors.  Alona Bene, MD Emergency Medicine   Thao Bauza, Arlyss Repress, MD 01/17/17 [phone]

## 2017-01-17 NOTE — ED Provider Notes (Signed)
I assumed care of this patient from Dr. Jacqulyn Bath at 1700.  Please see their note for further details of Hx, PE.  Briefly patient is a 21 y.o. male with a history of autism and prior pancreatitis who presents with abdominal pain, nausea and vomiting. Workup revealed leukocytosis, lipase within normal limits. Ultrasound and CT scan of the abdomen was unremarkable. Patient was treated symptomatically and had improved. During her workup it was noted that patient had an anion gap of 16. Currently awaiting a VBG to ensure he is not acidotic. If negative patient would be safe for discharge.   VBG reassuring. Patient and family still felt comfortable enough to go home.  The patient is safe for discharge with strict return precautions.  Disposition: Discharge  Condition: Good  I have discussed the results, Dx and Tx plan with the patient who expressed understanding and agree(s) with the plan. Discharge instructions discussed at great length. The patient was given strict return precautions who verbalized understanding of the instructions. No further questions at time of discharge.    New Prescriptions   ONDANSETRON (ZOFRAN ODT) 4 MG DISINTEGRATING TABLET    Take 1 tablet (4 mg total) by mouth every 8 (eight) hours as needed for nausea or vomiting.   TRAMADOL (ULTRAM) 50 MG TABLET    Take 1 tablet (50 mg total) by mouth every 6 (six) hours as needed.    Follow Up: Martha Clan, MD [address] [place] [address] [phone]  Call in 1 day for follow up appointment        Cardama, Amadeo Garnet, MD 01/17/17 Rickey Primus

## 2018-05-28 HISTORY — PX: WISDOM TOOTH EXTRACTION: SHX21

## 2020-06-10 ENCOUNTER — Other Ambulatory Visit (HOSPITAL_COMMUNITY): Payer: Self-pay | Admitting: Internal Medicine

## 2020-06-10 ENCOUNTER — Other Ambulatory Visit: Payer: Self-pay

## 2020-06-10 ENCOUNTER — Ambulatory Visit (HOSPITAL_COMMUNITY)
Admission: RE | Admit: 2020-06-10 | Discharge: 2020-06-10 | Disposition: A | Discharge status: Home / Self Care | Payer: Medicare Other | Source: Ambulatory Visit | Attending: Internal Medicine | Admitting: Internal Medicine

## 2020-06-10 DIAGNOSIS — R1011 Right upper quadrant pain: Secondary | ICD-10-CM | POA: Insufficient documentation

## 2020-06-10 DIAGNOSIS — K859 Acute pancreatitis without necrosis or infection, unspecified: Secondary | ICD-10-CM | POA: Insufficient documentation

## 2020-06-10 DIAGNOSIS — R1010 Upper abdominal pain, unspecified: Secondary | ICD-10-CM | POA: Diagnosis not present

## 2020-06-10 DIAGNOSIS — K838 Other specified diseases of biliary tract: Secondary | ICD-10-CM | POA: Diagnosis not present

## 2020-06-10 DIAGNOSIS — R1013 Epigastric pain: Secondary | ICD-10-CM | POA: Diagnosis not present

## 2020-06-15 ENCOUNTER — Other Ambulatory Visit: Payer: Self-pay | Admitting: Internal Medicine

## 2020-06-15 ENCOUNTER — Other Ambulatory Visit (HOSPITAL_COMMUNITY): Payer: Self-pay | Admitting: Internal Medicine

## 2020-06-15 DIAGNOSIS — K819 Cholecystitis, unspecified: Secondary | ICD-10-CM

## 2020-06-17 ENCOUNTER — Other Ambulatory Visit: Payer: Self-pay | Admitting: Internal Medicine

## 2020-06-17 DIAGNOSIS — K838 Other specified diseases of biliary tract: Secondary | ICD-10-CM

## 2020-06-21 ENCOUNTER — Ambulatory Visit
Admission: RE | Admit: 2020-06-21 | Discharge: 2020-06-21 | Disposition: A | Discharge status: Home / Self Care | Payer: Medicare Other | Source: Ambulatory Visit | Attending: Internal Medicine | Admitting: Internal Medicine

## 2020-06-21 DIAGNOSIS — R109 Unspecified abdominal pain: Secondary | ICD-10-CM | POA: Diagnosis not present

## 2020-06-21 DIAGNOSIS — K838 Other specified diseases of biliary tract: Secondary | ICD-10-CM

## 2020-06-21 MED ORDER — GADOBENATE DIMEGLUMINE 529 MG/ML IV SOLN
20.0000 mL | Freq: Once | INTRAVENOUS | Status: AC | PRN
  Administered 2020-06-21: 20 mL via INTRAVENOUS

## 2021-09-24 IMAGING — MR MR ABDOMEN WO/W CM MRCP
17 of 21 series · 39 of 48 positions shown · IV contrast (multihance)
Comparison: CT scan 01/17/2017.  MRI 12/24/2014.

CLINICAL DATA: Upper abdominal pain off and on since 8088. History
of pancreatitis.

EXAM:
MRI ABDOMEN WITHOUT AND WITH CONTRAST (INCLUDING MRCP)
TECHNIQUE: Multiplanar multisequence MR imaging of the abdomen was performed
both before and after the administration of intravenous contrast.
Heavily T2-weighted images of the biliary and pancreatic ducts were
obtained, and three-dimensional MRCP images were rendered by post
processing.
CONTRAST:  20mL MULTIHANCE GADOBENATE DIMEGLUMINE 529 MG/ML IV SOLN

[Series 3: T2 · coronal · 4.5mm · 1.56mm/px · 1 of 44 slices shown (1 of 4)]
[im 1/44]
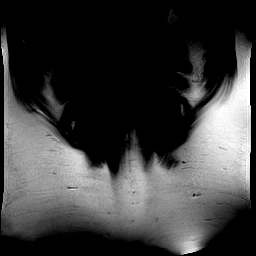

[Series 4: T2 · coronal · 3.0mm · 1.19mm/px · 1 of 57 slices shown (2 of 4)]
[im 1/57]
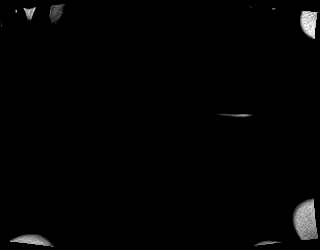

[Series 5: T1 · axial · 3.0mm · 1.19mm/px · z∈[-132,+105]mm · 4 of 160 slices shown]
[im 1/160]
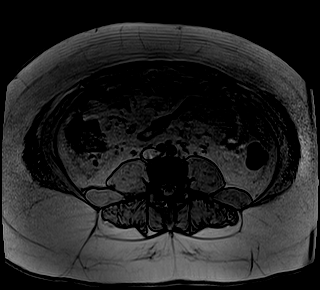
[im 54/160]
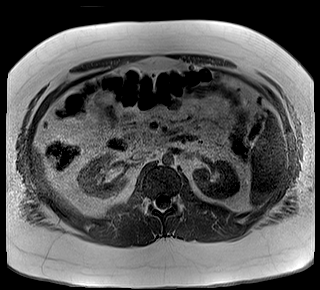
[im 107/160]
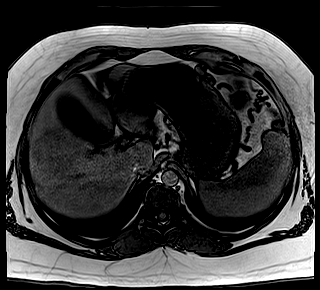
[im 160/160]
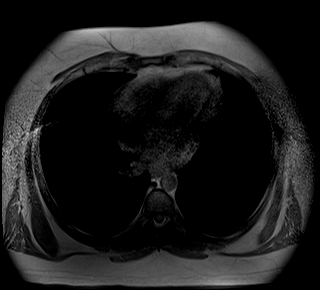

[Series 6: T2 · axial · 6.0mm · 1.19mm/px · 1 of 33 slices shown (3 of 4)]
[im 1/33]
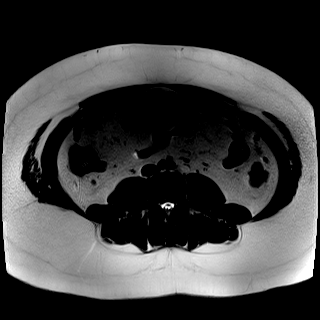

[Series 7: bSSFP · axial · 5.0mm · 1.25mm/px · 1 of 40 slices shown (1 of 2)]
[im 1/40]
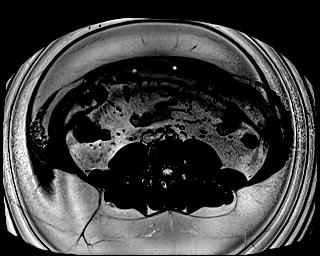

[Series 8: bSSFP · coronal · 5.0mm · 1.25mm/px · 1 of 39 slices shown (2 of 2)]
[im 1/39]
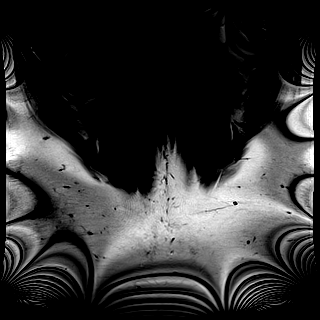

[Series 11: T2 · axial · 5.0mm · 1.48mm/px · 1 of 46 slices shown (4 of 4)]
[im 1/46]
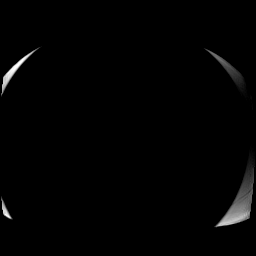

[Series 12: DWI · axial · 5.0mm · 1.42mm/px · z∈[-142,+128]mm · 4 of 138 slices shown (1 of 2)]
[im 1/138]
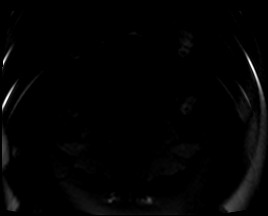
[im 46/138]
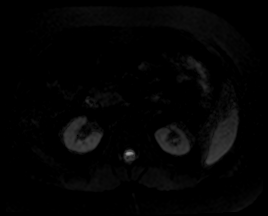
[im 92/138]
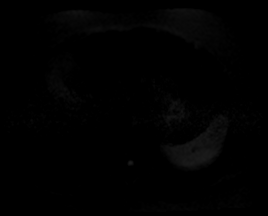
[im 138/138]
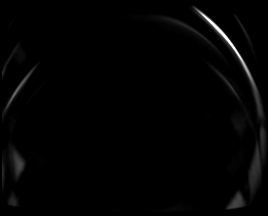

[Series 13: DWI · axial · 5.0mm · 1.42mm/px · z∈[-142,+128]mm · 2 of 46 slices shown (2 of 2)]
[im 1/46]
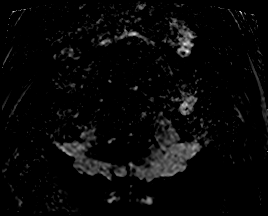
[im 46/46]
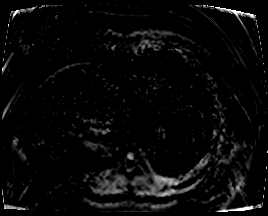

[Series 14: MRCP · coronal · 1.0mm · 0.49mm/px · 2 of 64 slices shown]
[im 1/64]
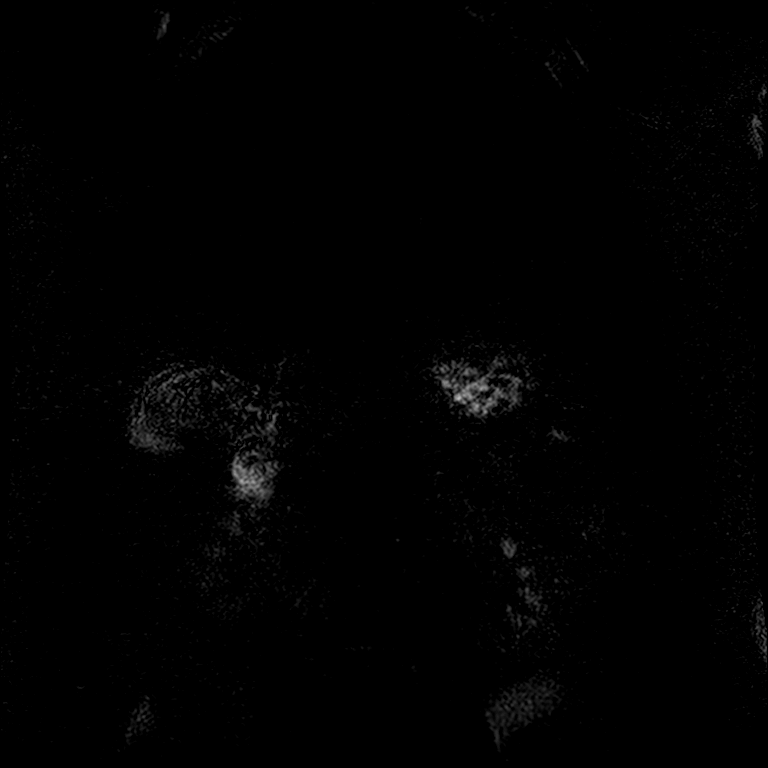
[im 64/64]
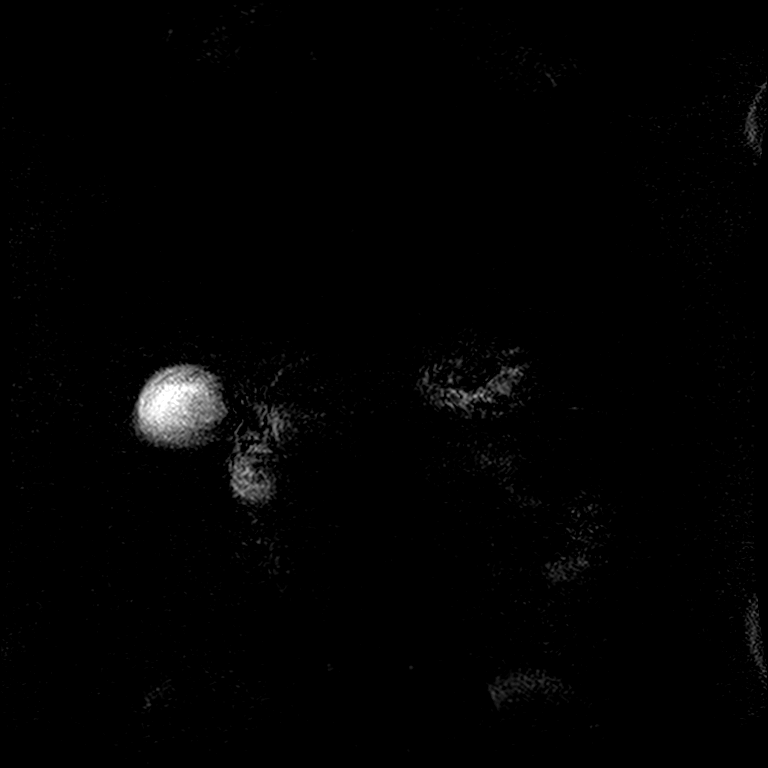

[Series 16: T1 dynamic · axial · non-contrast · 3.0mm · 1.25mm/px · z∈[-132,+105]mm · 3 of 80 slices shown]
[im 1/80]
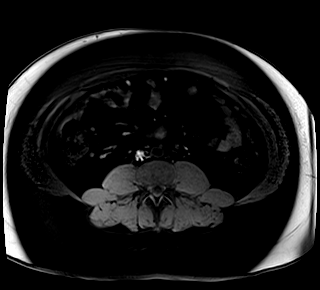
[im 40/80]
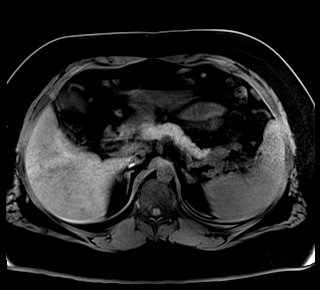
[im 80/80]
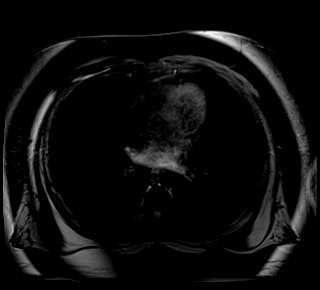

[Series 17: T1 dynamic post-contrast · axial · 3.0mm · 1.25mm/px · z∈[-132,+105]mm · 3 of 80 slices shown (1 of 6)]
[im 1/80]
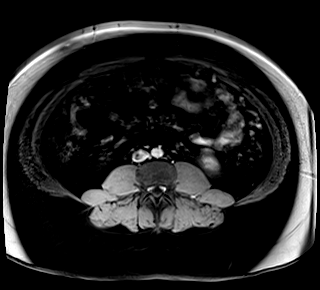
[im 40/80]
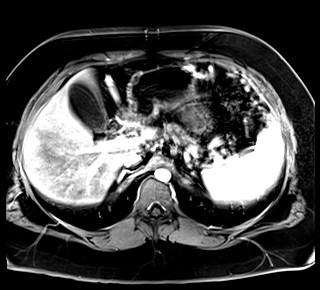
[im 80/80]
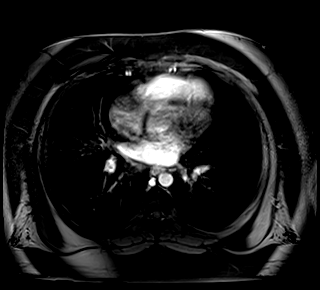

[Series 18: T1 dynamic post-contrast · axial · 3.0mm · 1.25mm/px · z∈[-132,+105]mm · 3 of 80 slices shown (2 of 6)]
[im 1/80]
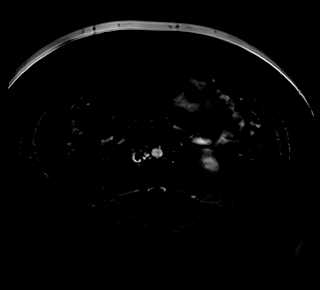
[im 40/80]
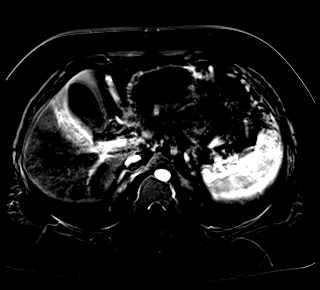
[im 80/80]
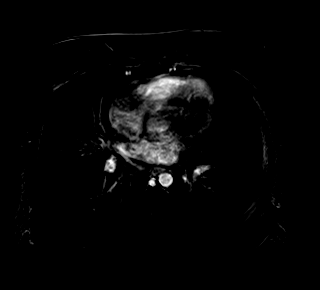

[Series 19: T1 dynamic post-contrast · axial · 3.0mm · 1.25mm/px · z∈[-132,+105]mm · 3 of 80 slices shown (3 of 6)]
[im 1/80]
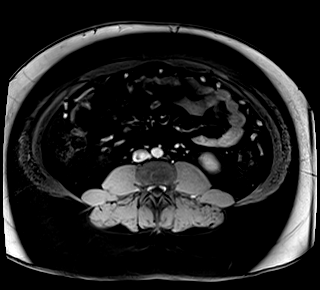
[im 40/80]
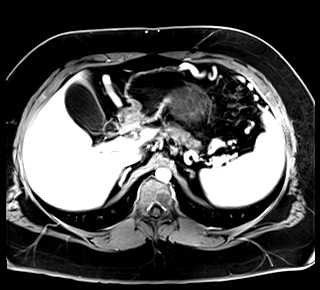
[im 80/80]
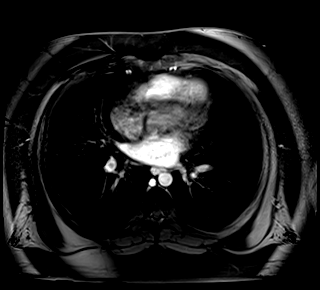

[Series 20: T1 dynamic post-contrast · axial · 3.0mm · 1.25mm/px · z∈[-132,+105]mm · 3 of 80 slices shown (4 of 6)]
[im 1/80]
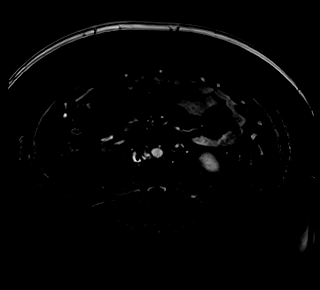
[im 40/80]
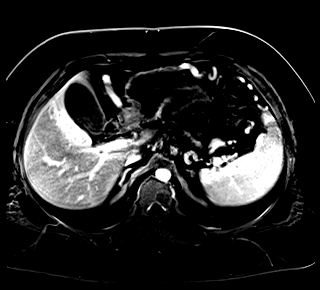
[im 80/80]
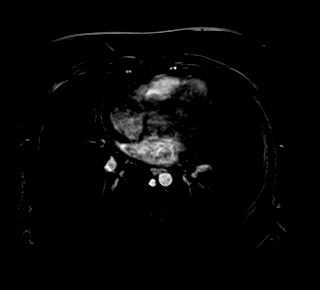

[Series 21: T1 dynamic post-contrast · axial · 3.0mm · 1.25mm/px · z∈[-132,+105]mm · 3 of 80 slices shown (5 of 6)]
[im 1/80]
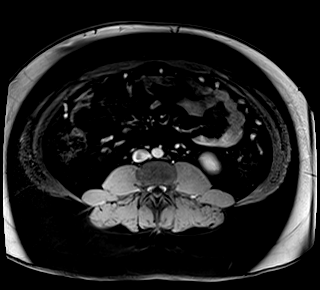
[im 40/80]
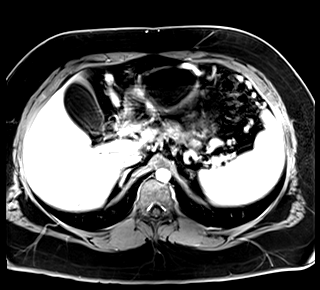
[im 80/80]
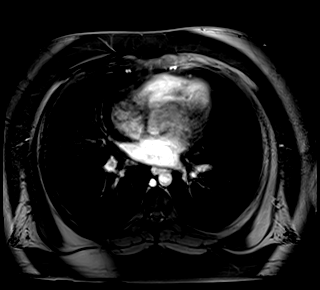

[Series 22: T1 dynamic post-contrast · axial · 3.0mm · 1.25mm/px · z∈[-132,+105]mm · 3 of 80 slices shown (6 of 6)]
[im 1/80]
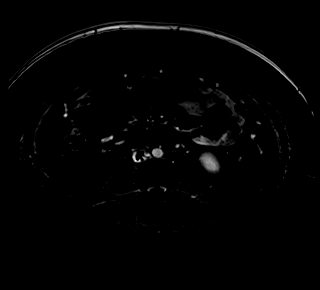
[im 40/80]
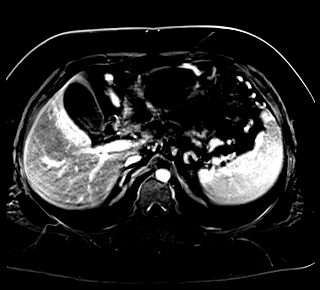
[im 80/80]
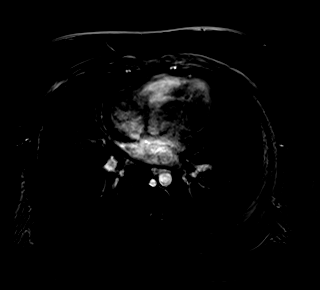

[39 of 48 positions shown; findings below may reference images not displayed]

FINDINGS: Motion degraded study.

Lower chest: Chest unremarkable.

Hepatobiliary: Mild diffuse fatty deposition noted in the liver
parenchyma. No focal suspicious abnormality in the liver no evidence
for gallstones. No intra or extrahepatic biliary duct dilatation. No
choledocholithiasis.

Pancreas: Assessment limited by motion degradation. Within this
limitation, there is no gross mass lesion evident in the pancreas.
No main duct dilatation. There is an irregular finger-like
projection of enhancing soft tissue anterior to the pancreas near
the junction of the head and body. This measures approximately 1.5 x
1.0 cm and is very similar in appearance to CT scan of 01/17/2017
where it was measured at 1.7 cm long axis. The posterior wall the
stomach appears to be tethered down towards this structure and it
likely reflect scarring given the interval chronicity in the
patient's history of pancreatitis. Similar finding on a study from
03/24/2015 measured 3.0 x 1.9 cm. As on prior studies, there is no
findings to suggest pancreas divisum although assessment is hindered
by motion artifact.

Spleen: 14.5 cm craniocaudal length, upper normal for size. Splenic
vein cannot be visualized throughout its course suggesting
thrombosis as described previously. Vascular caliber Pjereta Ke in
the left abdomen would be compatible with splenic vein occlusion
and/or portal venous hypertension.

Adrenals/Urinary Tract: No adrenal nodule or mass. Kidneys
unremarkable.

Stomach/Bowel: Stomach is moderately distended with fluid. No small
bowel or colonic dilatation within the visualized abdomen.

Vascular/Lymphatic: No abdominal aortic aneurysm. Portal vein and
superior mesenteric vein are patent. As above, patency of the
splenic vein cannot be confirmed on today's study. There is no
gastrohepatic or hepatoduodenal ligament lymphadenopathy. No
retroperitoneal or mesenteric lymphadenopathy.

Other:  No intraperitoneal free fluid.

Musculoskeletal: No focal suspicious marrow enhancement within the
visualized bony anatomy.
IMPRESSION: 1. No acute findings on today's study. Specifically, no findings of
acute pancreatitis.
2. No evidence for cholelithiasis. No intra or extrahepatic biliary
duct dilatation. No choledocholithiasis.
3. Small focus of irregular finger-like soft tissue projecting from
the anterior pancreas, showing progressive decrease in size over
multiple studies dating back to 03/24/2015. Those earlier exams
documented the presence of pseudocyst in this region and findings
today are similar to the 01/17/2017 exam, likely
granulation/scarring related to the prior inflammation.
4. Borderline splenomegaly with apparent chronic occlusion of the
splenic vein.

## 2022-06-14 DIAGNOSIS — K859 Acute pancreatitis without necrosis or infection, unspecified: Secondary | ICD-10-CM | POA: Diagnosis not present

## 2022-06-14 DIAGNOSIS — Z79899 Other long term (current) drug therapy: Secondary | ICD-10-CM | POA: Diagnosis not present

## 2022-06-21 DIAGNOSIS — Z23 Encounter for immunization: Secondary | ICD-10-CM | POA: Diagnosis not present

## 2022-06-21 DIAGNOSIS — Z0000 Encounter for general adult medical examination without abnormal findings: Secondary | ICD-10-CM | POA: Diagnosis not present

## 2022-06-21 DIAGNOSIS — R7301 Impaired fasting glucose: Secondary | ICD-10-CM | POA: Diagnosis not present

## 2022-06-21 DIAGNOSIS — Z1331 Encounter for screening for depression: Secondary | ICD-10-CM | POA: Diagnosis not present

## 2022-06-21 DIAGNOSIS — Z1339 Encounter for screening examination for other mental health and behavioral disorders: Secondary | ICD-10-CM | POA: Diagnosis not present

## 2022-06-21 DIAGNOSIS — R82998 Other abnormal findings in urine: Secondary | ICD-10-CM | POA: Diagnosis not present

## 2022-07-31 ENCOUNTER — Other Ambulatory Visit (HOSPITAL_BASED_OUTPATIENT_CLINIC_OR_DEPARTMENT_OTHER): Payer: Self-pay | Admitting: Internal Medicine

## 2022-07-31 ENCOUNTER — Encounter (HOSPITAL_BASED_OUTPATIENT_CLINIC_OR_DEPARTMENT_OTHER): Payer: Self-pay

## 2022-07-31 ENCOUNTER — Ambulatory Visit (HOSPITAL_BASED_OUTPATIENT_CLINIC_OR_DEPARTMENT_OTHER): Payer: Self-pay

## 2022-07-31 ENCOUNTER — Other Ambulatory Visit (HOSPITAL_COMMUNITY): Payer: Self-pay | Admitting: Internal Medicine

## 2022-07-31 ENCOUNTER — Ambulatory Visit (HOSPITAL_BASED_OUTPATIENT_CLINIC_OR_DEPARTMENT_OTHER)
Admission: RE | Admit: 2022-07-31 | Discharge: 2022-07-31 | Disposition: A | Discharge status: Home / Self Care | Payer: Medicare Other | Source: Ambulatory Visit | Attending: Internal Medicine | Admitting: Internal Medicine

## 2022-07-31 DIAGNOSIS — K859 Acute pancreatitis without necrosis or infection, unspecified: Secondary | ICD-10-CM

## 2022-07-31 DIAGNOSIS — F840 Autistic disorder: Secondary | ICD-10-CM | POA: Diagnosis not present

## 2022-07-31 DIAGNOSIS — R109 Unspecified abdominal pain: Secondary | ICD-10-CM | POA: Diagnosis not present

## 2022-07-31 DIAGNOSIS — R7989 Other specified abnormal findings of blood chemistry: Secondary | ICD-10-CM | POA: Diagnosis not present

## 2022-07-31 MED ORDER — IOHEXOL 300 MG/ML  SOLN
100.0000 mL | Freq: Once | INTRAMUSCULAR | Status: AC | PRN
  Administered 2022-07-31: 100 mL via INTRAVENOUS

## 2022-08-01 ENCOUNTER — Other Ambulatory Visit (HOSPITAL_COMMUNITY): Payer: Self-pay | Admitting: Internal Medicine

## 2022-08-01 DIAGNOSIS — K859 Acute pancreatitis without necrosis or infection, unspecified: Secondary | ICD-10-CM

## 2022-08-02 ENCOUNTER — Ambulatory Visit (HOSPITAL_COMMUNITY)
Admission: RE | Admit: 2022-08-02 | Discharge: 2022-08-02 | Disposition: A | Discharge status: Home / Self Care | Payer: Medicare Other | Source: Ambulatory Visit | Attending: Internal Medicine | Admitting: Internal Medicine

## 2022-08-02 DIAGNOSIS — K859 Acute pancreatitis without necrosis or infection, unspecified: Secondary | ICD-10-CM | POA: Diagnosis not present

## 2022-08-02 DIAGNOSIS — K760 Fatty (change of) liver, not elsewhere classified: Secondary | ICD-10-CM | POA: Diagnosis not present

## 2022-08-17 ENCOUNTER — Other Ambulatory Visit: Payer: Self-pay | Admitting: Surgery

## 2022-08-17 DIAGNOSIS — K811 Chronic cholecystitis: Secondary | ICD-10-CM | POA: Diagnosis not present

## 2022-09-04 ENCOUNTER — Encounter (HOSPITAL_COMMUNITY)
Admission: RE | Admit: 2022-09-04 | Discharge: 2022-09-04 | Disposition: A | Discharge status: Home / Self Care | Payer: Medicare Other | Source: Ambulatory Visit | Attending: Surgery | Admitting: Surgery

## 2022-09-04 ENCOUNTER — Other Ambulatory Visit: Payer: Self-pay

## 2022-09-04 ENCOUNTER — Encounter (HOSPITAL_COMMUNITY): Payer: Self-pay

## 2022-09-04 VITALS — BP 143/93 | HR 85 | Temp 98.5°F | Resp 18 | Ht 69.0 in | Wt 256.3 lb

## 2022-09-04 DIAGNOSIS — G988 Other disorders of nervous system: Secondary | ICD-10-CM

## 2022-09-04 DIAGNOSIS — Z01812 Encounter for preprocedural laboratory examination: Secondary | ICD-10-CM | POA: Insufficient documentation

## 2022-09-04 LAB — CBC
HCT: 47.8 % (ref 39.0–52.0)
Hemoglobin: 14.6 g/dL (ref 13.0–17.0)
MCH: 25.7 pg — ABNORMAL LOW (ref 26.0–34.0)
MCHC: 30.5 g/dL (ref 30.0–36.0)
MCV: 84.2 fL (ref 80.0–100.0)
Platelets: 145 10*3/uL — ABNORMAL LOW (ref 150–400)
RBC: 5.68 MIL/uL (ref 4.22–5.81)
RDW: 13.4 % (ref 11.5–15.5)
WBC: 4.8 10*3/uL (ref 4.0–10.5)
nRBC: 0 % (ref 0.0–0.2)

## 2022-09-04 LAB — BASIC METABOLIC PANEL
Anion gap: 12 (ref 5–15)
BUN: 7 mg/dL (ref 6–20)
CO2: 24 mmol/L (ref 22–32)
Calcium: 9.1 mg/dL (ref 8.9–10.3)
Chloride: 107 mmol/L (ref 98–111)
Creatinine, Ser: 0.98 mg/dL (ref 0.61–1.24)
GFR, Estimated: >60 mL/min (ref >60)
Glucose, Bld: 94 mg/dL (ref 70–99)
Potassium: 4.2 mmol/L (ref 3.5–5.1)
Sodium: 143 mmol/L (ref 135–145)

## 2022-09-04 NOTE — Progress Notes (Signed)
PCP - Dr. Martha Clan Cardiologist - Denies  PPM/ICD - Denies Device Orders - n/a Rep Notified - n/a  Chest x-ray - n/a EKG - Denies Stress Test - Denies ECHO - Denies Cardiac Cath - Denies  Sleep Study - Denies CPAP - n/a  No DM  Last dose of GLP1 agonist- n/a GLP1 instructions: n/a  Blood Thinner Instructions: n/a Aspirin Instructions: n/a  ERAS Protcol - Clear liquids until 0430 morning of surgery PRE-SURGERY Ensure or G2- Ensure given to patient and instructions reviewed with pt and pts mother  COVID TEST- n/a   Anesthesia review: No.  Patient denies shortness of breath, fever, cough and chest pain at PAT appointment. Pt denies any respiratory illness/infection in the last two months.   All instructions explained to the patient and patients mother, with a verbal understanding of the material. Patient agrees to go over the instructions while at home for a better understanding. Patient also instructed to self quarantine after being tested for COVID-19. The opportunity to ask questions was provided.

## 2022-09-04 NOTE — Pre-Procedure Instructions (Signed)
Surgical Instructions    Your procedure is scheduled on September 13, 2022.  Report to [facility] Main Entrance "A" at 5:30 A.M., then check in with the Admitting office.  Call this number if you have problems the morning of surgery:  [phone]  If you have any questions prior to your surgery date call [phone]: Open Monday-Friday 8am-4pm If you experience any cold or flu symptoms such as cough, fever, chills, shortness of breath, etc. between now and your scheduled surgery, please notify us at the above number.     Remember:  Do not eat after midnight the night before your surgery  You may drink clear liquids until 4:30 AM the morning of your surgery.   Clear liquids allowed are: Water, Non-Citrus Juices (without pulp), Carbonated Beverages, Clear Tea, Black Coffee Only (NO MILK, CREAM OR POWDERED CREAMER of any kind), and Gatorade.  Patient Instructions  The night before surgery:  No food after midnight. ONLY clear liquids after midnight  The day of surgery (if you do NOT have diabetes):  Drink ONE (1) Pre-Surgery Clear Ensure by 4:30 AM the morning of surgery. Drink in one sitting. Do not sip.  This drink was given to you during your hospital  pre-op appointment visit.  Nothing else to drink after completing the  Pre-Surgery Clear Ensure.         If you have questions, please contact your surgeon's office.     Take these medicines the morning of surgery with A SIP OF WATER:  NONE   As of today, STOP taking any Aspirin (unless otherwise instructed by your surgeon) Aleve, Naproxen, Ibuprofen, Motrin, Advil, Goody's, BC's, all herbal medications, fish oil, and all vitamins.                     Do NOT Smoke (Tobacco/Vaping) for 24 hours prior to your procedure.  If you use a CPAP at night, you may bring your mask/headgear for your overnight stay.   Contacts, glasses, piercing's, hearing aid's, dentures or partials may not be worn into surgery, please bring cases for  these belongings.    For patients admitted to the hospital, discharge time will be determined by your treatment team.   Patients discharged the day of surgery will not be allowed to drive home, and someone needs to stay with them for 24 hours.  SURGICAL WAITING ROOM VISITATION Patients having surgery or a procedure may have no more than 2 support people in the waiting area - these visitors may rotate.   Children under the age of 69 must have an adult with them who is not the patient. If the patient needs to stay at the hospital during part of their recovery, the visitor guidelines for inpatient rooms apply. Pre-op nurse will coordinate an appropriate time for 1 support person to accompany patient in pre-op.  This support person may not rotate.   Please refer to the [facility] website for the visitor guidelines for Inpatients (after your surgery is over and you are in a regular room).    Special instructions:   [place]- Preparing For Surgery  Before surgery, you can play an important role. Because skin is not sterile, your skin needs to be as free of germs as possible. You can reduce the number of germs on your skin by washing with CHG (chlorahexidine gluconate) Soap before surgery.  CHG is an antiseptic cleaner which kills germs and bonds with the skin to continue killing germs even after washing.  Oral Hygiene is also important to reduce your risk of infection.  Remember - BRUSH YOUR TEETH THE MORNING OF SURGERY WITH YOUR REGULAR TOOTHPASTE  Please do not use if you have an allergy to CHG or antibacterial soaps. If your skin becomes reddened/irritated stop using the CHG.  Do not shave (including legs and underarms) for at least 48 hours prior to first CHG shower. It is OK to shave your face.  Please follow these instructions carefully.   Shower the NIGHT BEFORE SURGERY and the MORNING OF SURGERY  If you chose to wash your hair, wash your hair first as usual with your normal  shampoo.  After you shampoo, rinse your hair and body thoroughly to remove the shampoo.  Use CHG Soap as you would any other liquid soap. You can apply CHG directly to the skin and wash gently with a scrungie or a clean washcloth.   Apply the CHG Soap to your body ONLY FROM THE NECK DOWN.  Do not use on open wounds or open sores. Avoid contact with your eyes, ears, mouth and genitals (private parts). Wash Face and genitals (private parts)  with your normal soap.   Wash thoroughly, paying special attention to the area where your surgery will be performed.  Thoroughly rinse your body with warm water from the neck down.  DO NOT shower/wash with your normal soap after using and rinsing off the CHG Soap.  Pat yourself dry with a CLEAN TOWEL.  Wear CLEAN PAJAMAS to bed the night before surgery  Place CLEAN SHEETS on your bed the night before your surgery  DO NOT SLEEP WITH PETS.   Day of Surgery: Take a shower with CHG soap. Do not wear jewelry or makeup Do not wear lotions, powders, perfumes/colognes, or deodorant. Do not shave 48 hours prior to surgery.  Men may shave face and neck. Do not bring valuables to the hospital.  [facility] is not responsible for any belongings or valuables. Do not wear nail polish, gel polish, artificial nails, or any other type of covering on natural nails (fingers and toes) If you have artificial nails or gel coating that need to be removed by a nail salon, please have this removed prior to surgery. Artificial nails or gel coating may interfere with anesthesia's ability to adequately monitor your vital signs.  Wear Clean/Comfortable clothing the morning of surgery Remember to brush your teeth WITH YOUR REGULAR TOOTHPASTE.   Please read over the following fact sheets that you were given.    If you received a COVID test during your pre-op visit  it is requested that you wear a mask when out in public, stay away from anyone that may not be feeling well  and notify your surgeon if you develop symptoms. If you have been in contact with anyone that has tested positive in the last 10 days please notify you surgeon.

## 2022-09-12 NOTE — H&P (Signed)
  REFERRING PHYSICIAN: Dante Gang* PROVIDER: Wayne Both, MD MRN: [id number] DOB: 1996/03/26  Subjective  Chief Complaint: New Consultation (Gallbladder removal)  History of Present Illness: Daryl Foster is a 27 y.o. male who is seen today as an office consultation for evaluation of New Consultation (Gallbladder removal)  This is a 28 year old gentleman with autism who is accompanied with his parents. He has been having intermittent epigastric and right upper quadrant abdominal pain for many years. He has even had several bouts of pancreatitis. He had 1 episode with elevated liver function test. He has had an extensive workup including ultrasound which showed only a thickened gallbladder wall with no stones, and negative MR CP, and endoscopy. His attacks will last several weeks and then improve when he is on a liquid diet. The pain can be moderate to severe at times. They have not noticed any jaundice. Bowel movements are normal.  Review of Systems: A complete review of systems was obtained from the patient. I have reviewed this information and discussed as appropriate with the patient. See HPI as well for other ROS.  ROS  Medical History: History reviewed. No pertinent past medical history.  Patient Active Problem List Diagnosis Abdominal pain, epigastric Nausea without vomiting Pancreatitis Splenic vein thrombosis  History reviewed. No pertinent surgical history.  No Known Allergies  No current outpatient medications on file prior to visit.  No current facility-administered medications on file prior to visit.  History reviewed. No pertinent family history.  Social History  Tobacco Use Smoking Status Never Smokeless Tobacco Never   Social History  Socioeconomic History Marital status: Single Tobacco Use Smoking status: Never Smokeless tobacco: Never Substance and Sexual Activity Alcohol use: Not Currently Drug use:  Never  Objective:  Vitals:  BP: 137/86 Pulse: 93 Temp: 36.1 C (97 F) SpO2: 99% Weight: (!) 114.3 kg (252 lb) Height: 175.3 cm ( ) PainSc: 0-No pain  Body mass index is 37.21 kg/m.  Physical Exam  He appears well on exam and is alert and his commands  Abdomen is soft and obese. There is very slight tenderness to some deep palpation in the right upper quadrant  Labs, Imaging and Diagnostic Testing: I have reviewed his labs, diagnostic studies including the MRI and ultrasound as well as CT scan and notes from his primary care provider  Assessment and Plan:  Diagnoses and all orders for this visit:  Chronic cholecystitis   I believe he has chronic cholecystitis and possibly missed tiny gallstones given the bouts of pancreatitis and elevated liver function test. I discussed this extensively with the patient and his family. We discussed continued conservative management versus proceeding with a laparoscopic cholecystectomy. I gave him literature regarding gallbladder surgery. I explained the surgical procedure in detail. We discussed the risks which includes but not limited to bleeding, infection, the need to convert to open procedure, bile duct injury, bile leak, the need for other procedures, the chance this may not resolve all his abdominal issues, cardiopulmonary issues with anesthesia, postoperative recovery, etc. They understand and wish to proceed with surgery which will be scheduled.

## 2022-09-12 NOTE — Anesthesia Preprocedure Evaluation (Signed)
Anesthesia Evaluation  Patient identified by MRN, date of birth, ID band Patient awake    Reviewed: Allergy & Precautions, NPO status , Patient's Chart, lab work & pertinent test results  Airway Mallampati: II  TM Distance: >3 FB Neck ROM: Full    Dental  (+) Teeth Intact, Dental Advisory Given   Pulmonary neg pulmonary ROS   Pulmonary exam normal breath sounds clear to auscultation       Cardiovascular negative cardio ROS Normal cardiovascular exam Rhythm:Regular Rate:Normal     Neuro/Psych negative neurological ROS     GI/Hepatic negative GI ROS,,,CHRONIC CHOLECYSTITIS   Endo/Other  Obesity   Renal/GU negative Renal ROS     Musculoskeletal negative musculoskeletal ROS (+)    Abdominal   Peds  (+) mental retardation Hematology  (+) Blood dyscrasia (Thrombocytopenia; Xarelto)   Anesthesia Other Findings   Reproductive/Obstetrics                             Anesthesia Physical Anesthesia Plan  ASA: 3  Anesthesia Plan: General   Post-op Pain Management: Tylenol PO (pre-op)*   Induction: Intravenous  PONV Risk Score and Plan: 3 and Midazolam, Dexamethasone and Ondansetron  Airway Management Planned: Oral ETT  Additional Equipment:   Intra-op Plan:   Post-operative Plan: Extubation in OR  Informed Consent: I have reviewed the patients History and Physical, chart, labs and discussed the procedure including the risks, benefits and alternatives for the proposed anesthesia with the patient or authorized representative who has indicated his/her understanding and acceptance.     Dental advisory given  Plan Discussed with: CRNA  Anesthesia Plan Comments:         Anesthesia Quick Evaluation

## 2022-09-13 ENCOUNTER — Encounter (HOSPITAL_COMMUNITY): Payer: Self-pay | Admitting: Surgery

## 2022-09-13 ENCOUNTER — Ambulatory Visit (HOSPITAL_COMMUNITY)
Admission: RE | Admit: 2022-09-13 | Discharge: 2022-09-13 | Disposition: A | Discharge status: Home / Self Care | Payer: Medicare Other | Attending: Surgery | Admitting: Surgery

## 2022-09-13 ENCOUNTER — Encounter (HOSPITAL_COMMUNITY)
Admission: RE | Disposition: A | Discharge status: Home / Self Care | Payer: Self-pay | Source: Home / Self Care | Attending: Surgery

## 2022-09-13 ENCOUNTER — Ambulatory Visit (HOSPITAL_COMMUNITY): Payer: Medicare Other | Admitting: Anesthesiology

## 2022-09-13 ENCOUNTER — Ambulatory Visit (HOSPITAL_BASED_OUTPATIENT_CLINIC_OR_DEPARTMENT_OTHER): Payer: Medicare Other | Admitting: Anesthesiology

## 2022-09-13 ENCOUNTER — Other Ambulatory Visit: Payer: Self-pay

## 2022-09-13 DIAGNOSIS — Z6836 Body mass index (BMI) 36.0-36.9, adult: Secondary | ICD-10-CM | POA: Diagnosis not present

## 2022-09-13 DIAGNOSIS — D696 Thrombocytopenia, unspecified: Secondary | ICD-10-CM | POA: Diagnosis not present

## 2022-09-13 DIAGNOSIS — E669 Obesity, unspecified: Secondary | ICD-10-CM | POA: Diagnosis not present

## 2022-09-13 DIAGNOSIS — F840 Autistic disorder: Secondary | ICD-10-CM | POA: Insufficient documentation

## 2022-09-13 DIAGNOSIS — K811 Chronic cholecystitis: Secondary | ICD-10-CM

## 2022-09-13 HISTORY — PX: CHOLECYSTECTOMY: SHX55

## 2022-09-13 SURGERY — LAPAROSCOPIC CHOLECYSTECTOMY
Anesthesia: General | Site: Abdomen

## 2022-09-13 MED ORDER — ROCURONIUM BROMIDE 10 MG/ML (PF) SYRINGE
PREFILLED_SYRINGE | INTRAVENOUS | Status: DC | PRN
  Administered 2022-09-13: 60 mg via INTRAVENOUS

## 2022-09-13 MED ORDER — BUPIVACAINE-EPINEPHRINE (PF) 0.5% -1:200000 IJ SOLN
INTRAMUSCULAR | Status: AC
  Filled 2022-09-13: qty 30

## 2022-09-13 MED ORDER — HYDROCODONE-ACETAMINOPHEN 5-325 MG PO TABS: 1.0000 | ORAL_TABLET | Freq: Four times a day (QID) | ORAL | 0 refills | Status: AC | PRN

## 2022-09-13 MED ORDER — SODIUM CHLORIDE 0.9 % IR SOLN
Status: DC | PRN
  Administered 2022-09-13: 1000 mL

## 2022-09-13 MED ORDER — LIDOCAINE 2% (20 MG/ML) 5 ML SYRINGE
INTRAMUSCULAR | Status: AC
  Filled 2022-09-13: qty 5

## 2022-09-13 MED ORDER — CHLORHEXIDINE GLUCONATE CLOTH 2 % EX PADS: 6.0000 | MEDICATED_PAD | Freq: Once | CUTANEOUS | Status: DC

## 2022-09-13 MED ORDER — PROPOFOL 10 MG/ML IV BOLUS
INTRAVENOUS | Status: DC | PRN
  Administered 2022-09-13: 200 mg via INTRAVENOUS

## 2022-09-13 MED ORDER — FENTANYL CITRATE (PF) 250 MCG/5ML IJ SOLN
INTRAMUSCULAR | Status: AC
  Filled 2022-09-13: qty 5

## 2022-09-13 MED ORDER — PROMETHAZINE HCL 25 MG/ML IJ SOLN: 6.2500 mg | INTRAMUSCULAR | Status: DC | PRN

## 2022-09-13 MED ORDER — FENTANYL CITRATE (PF) 250 MCG/5ML IJ SOLN
INTRAMUSCULAR | Status: DC | PRN
  Administered 2022-09-13: 50 ug via INTRAVENOUS
  Administered 2022-09-13: 100 ug via INTRAVENOUS
  Administered 2022-09-13: 50 ug via INTRAVENOUS

## 2022-09-13 MED ORDER — DEXAMETHASONE SODIUM PHOSPHATE 10 MG/ML IJ SOLN
INTRAMUSCULAR | Status: DC | PRN
  Administered 2022-09-13: 10 mg via INTRAVENOUS

## 2022-09-13 MED ORDER — MIDAZOLAM HCL 2 MG/2ML IJ SOLN
INTRAMUSCULAR | Status: DC | PRN
  Administered 2022-09-13: 2 mg via INTRAVENOUS

## 2022-09-13 MED ORDER — DEXAMETHASONE SODIUM PHOSPHATE 10 MG/ML IJ SOLN
INTRAMUSCULAR | Status: AC
  Filled 2022-09-13: qty 1

## 2022-09-13 MED ORDER — CHLORHEXIDINE GLUCONATE 0.12 % MT SOLN
15.0000 mL | Freq: Once | OROMUCOSAL | Status: AC
  Administered 2022-09-13: 15 mL via OROMUCOSAL
  Filled 2022-09-13: qty 15

## 2022-09-13 MED ORDER — FENTANYL CITRATE (PF) 100 MCG/2ML IJ SOLN: 25.0000 ug | INTRAMUSCULAR | Status: DC | PRN

## 2022-09-13 MED ORDER — BUPIVACAINE-EPINEPHRINE (PF) 0.5% -1:200000 IJ SOLN
INTRAMUSCULAR | Status: DC | PRN
  Administered 2022-09-13: 20 mL

## 2022-09-13 MED ORDER — LACTATED RINGERS IV SOLN: INTRAVENOUS | Status: DC

## 2022-09-13 MED ORDER — ROCURONIUM BROMIDE 10 MG/ML (PF) SYRINGE
PREFILLED_SYRINGE | INTRAVENOUS | Status: AC
  Filled 2022-09-13: qty 10

## 2022-09-13 MED ORDER — ONDANSETRON HCL 4 MG/2ML IJ SOLN
INTRAMUSCULAR | Status: DC | PRN
  Administered 2022-09-13: 4 mg via INTRAVENOUS

## 2022-09-13 MED ORDER — ENSURE PRE-SURGERY PO LIQD: 296.0000 mL | Freq: Once | ORAL | Status: DC

## 2022-09-13 MED ORDER — ACETAMINOPHEN 500 MG PO TABS
1000.0000 mg | ORAL_TABLET | ORAL | Status: AC
  Administered 2022-09-13: 1000 mg via ORAL
  Filled 2022-09-13: qty 2

## 2022-09-13 MED ORDER — PROPOFOL 10 MG/ML IV BOLUS
INTRAVENOUS | Status: AC
  Filled 2022-09-13: qty 20

## 2022-09-13 MED ORDER — DEXMEDETOMIDINE HCL IN NACL 80 MCG/20ML IV SOLN
INTRAVENOUS | Status: AC
  Filled 2022-09-13: qty 20

## 2022-09-13 MED ORDER — CEFAZOLIN SODIUM-DEXTROSE 2-4 GM/100ML-% IV SOLN
2.0000 g | INTRAVENOUS | Status: AC
  Administered 2022-09-13: 2 g via INTRAVENOUS
  Filled 2022-09-13: qty 100

## 2022-09-13 MED ORDER — ONDANSETRON HCL 4 MG/2ML IJ SOLN
INTRAMUSCULAR | Status: AC
  Filled 2022-09-13: qty 2

## 2022-09-13 MED ORDER — ORAL CARE MOUTH RINSE: 15.0000 mL | Freq: Once | OROMUCOSAL | Status: AC

## 2022-09-13 MED ORDER — LIDOCAINE 2% (20 MG/ML) 5 ML SYRINGE
INTRAMUSCULAR | Status: DC | PRN
  Administered 2022-09-13: 100 mg via INTRAVENOUS

## 2022-09-13 MED ORDER — DEXMEDETOMIDINE HCL IN NACL 80 MCG/20ML IV SOLN
INTRAVENOUS | Status: DC | PRN
  Administered 2022-09-13 (×3): 8 ug via BUCCAL

## 2022-09-13 MED ORDER — SUGAMMADEX SODIUM 200 MG/2ML IV SOLN
INTRAVENOUS | Status: DC | PRN
  Administered 2022-09-13: 400 mg via INTRAVENOUS

## 2022-09-13 MED ORDER — 0.9 % SODIUM CHLORIDE (POUR BTL) OPTIME
TOPICAL | Status: DC | PRN
  Administered 2022-09-13: 1000 mL

## 2022-09-13 MED ORDER — MIDAZOLAM HCL 2 MG/2ML IJ SOLN
INTRAMUSCULAR | Status: AC
  Filled 2022-09-13: qty 2

## 2022-09-13 SURGICAL SUPPLY — 40 items
ADH SKN CLS APL DERMABOND .7 (GAUZE/BANDAGES/DRESSINGS) ×1
APL PRP STRL LF DISP 70% ISPRP (MISCELLANEOUS) ×1
APPLIER CLIP 5 13 M/L LIGAMAX5 (MISCELLANEOUS) ×1
APR CLP MED LRG 5 ANG JAW (MISCELLANEOUS) ×1
BAG COUNTER SPONGE SURGICOUNT (BAG) ×1 IMPLANT
BAG SPNG CNTER NS LX DISP (BAG) ×1
CANISTER SUCT 3000ML PPV (MISCELLANEOUS) ×1 IMPLANT
CHLORAPREP W/TINT 26 (MISCELLANEOUS) ×1 IMPLANT
CLIP APPLIE 5 13 M/L LIGAMAX5 (MISCELLANEOUS) ×1 IMPLANT
COVER SURGICAL LIGHT HANDLE (MISCELLANEOUS) ×1 IMPLANT
DERMABOND ADVANCED .7 DNX12 (GAUZE/BANDAGES/DRESSINGS) ×1 IMPLANT
ELECT REM PT RETURN 9FT ADLT (ELECTROSURGICAL) ×1
ELECTRODE REM PT RTRN 9FT ADLT (ELECTROSURGICAL) ×1 IMPLANT
GLOVE BIOGEL PI IND STRL 6.5 (GLOVE) IMPLANT
GLOVE BIOGEL PI IND STRL 7.0 (GLOVE) IMPLANT
GLOVE SURG SIGNA 7.5 PF LTX (GLOVE) ×1 IMPLANT
GOWN STRL REUS W/ TWL LRG LVL3 (GOWN DISPOSABLE) ×2 IMPLANT
GOWN STRL REUS W/ TWL XL LVL3 (GOWN DISPOSABLE) ×1 IMPLANT
GOWN STRL REUS W/TWL LRG LVL3 (GOWN DISPOSABLE) ×2
GOWN STRL REUS W/TWL XL LVL3 (GOWN DISPOSABLE) ×1
IRRIG SUCT STRYKERFLOW 2 WTIP (MISCELLANEOUS) ×1
IRRIGATION SUCT STRKRFLW 2 WTP (MISCELLANEOUS) ×1 IMPLANT
KIT BASIN OR (CUSTOM PROCEDURE TRAY) ×1 IMPLANT
KIT TURNOVER KIT B (KITS) ×1 IMPLANT
NS IRRIG 1000ML POUR BTL (IV SOLUTION) ×1 IMPLANT
PAD ARMBOARD 7.5X6 YLW CONV (MISCELLANEOUS) ×1 IMPLANT
SCISSORS LAP 5X35 DISP (ENDOMECHANICALS) ×1 IMPLANT
SET TUBE SMOKE EVAC HIGH FLOW (TUBING) ×1 IMPLANT
SLEEVE Z-THREAD 5X100MM (TROCAR) ×2 IMPLANT
SPECIMEN JAR SMALL (MISCELLANEOUS) ×1 IMPLANT
SUT MNCRL AB 4-0 PS2 18 (SUTURE) ×1 IMPLANT
SYS BAG RETRIEVAL 10MM (BASKET) ×1
SYSTEM BAG RETRIEVAL 10MM (BASKET) ×1 IMPLANT
TOWEL GREEN STERILE (TOWEL DISPOSABLE) ×1 IMPLANT
TOWEL GREEN STERILE FF (TOWEL DISPOSABLE) ×1 IMPLANT
TRAY LAPAROSCOPIC MC (CUSTOM PROCEDURE TRAY) ×1 IMPLANT
TROCAR BALLN 12MMX100 BLUNT (TROCAR) ×1 IMPLANT
TROCAR Z-THREAD OPTICAL 5X100M (TROCAR) ×1 IMPLANT
WARMER LAPAROSCOPE (MISCELLANEOUS) ×1 IMPLANT
WATER STERILE IRR 1000ML POUR (IV SOLUTION) ×1 IMPLANT

## 2022-09-13 NOTE — Discharge Instructions (Signed)
[facility] ______[facility] LAPAROSCOPIC SURGERY: POST OP INSTRUCTIONS Always review your discharge instruction sheet given to you by the facility where your surgery was performed. IF YOU HAVE DISABILITY OR FAMILY LEAVE FORMS, YOU MUST BRING THEM TO THE OFFICE FOR PROCESSING.   DO NOT GIVE THEM TO YOUR DOCTOR.  A prescription for pain medication may be given to you upon discharge.  Take your pain medication as prescribed, if needed.  If narcotic pain medicine is not needed, then you may take acetaminophen (Tylenol) or ibuprofen (Advil) as needed. Take your usually prescribed medications unless otherwise directed. If you need a refill on your pain medication, please contact your pharmacy.  They will contact our office to request authorization. Prescriptions will not be filled after 5pm or on week-ends. You should follow a light diet the first few days after arrival home, such as soup and crackers, etc.  Be sure to include lots of fluids daily. Most patients will experience some swelling and bruising in the area of the incisions.  Ice packs will help.  Swelling and bruising can take several days to resolve.  It is common to experience some constipation if taking pain medication after surgery.  Increasing fluid intake and taking a stool softener (such as Colace) will usually help or prevent this problem from occurring.  A mild laxative (Milk of Magnesia or Miralax) should be taken according to package instructions if there are no bowel movements after 48 hours. Unless discharge instructions indicate otherwise, you may remove your bandages 24-48 hours after surgery, and you may shower at that time.  You may have steri-strips (small skin tapes) in place directly over the incision.  These strips should be left on the skin for 7-10 days.  If your surgeon used skin glue on the incision, you may shower in 24 hours.  The glue will flake off over the next 2-3 weeks.  Any sutures or staples will be  removed at the office during your follow-up visit. ACTIVITIES:  You may resume regular (light) daily activities beginning the next day--such as daily self-care, walking, climbing stairs--gradually increasing activities as tolerated.  You may have sexual intercourse when it is comfortable.  Refrain from any heavy lifting or straining until approved by your doctor. You may drive when you are no longer taking prescription pain medication, you can comfortably wear a seatbelt, and you can safely maneuver your car and apply brakes. RETURN TO WORK:  __________________________________________________________ You should see your doctor in the office for a follow-up appointment approximately 2-3 weeks after your surgery.  Make sure that you call for this appointment within a day or two after you arrive home to insure a convenient appointment time. OTHER INSTRUCTIONS: YOU MAY SHOWER STARTING TOMORROW ICE PACK, TYLENOL, AND IBUPROFEN ALSO FOR PAIN NO LIFTING MORE THAN 15 POUNDS FOR 2 WEEKS __________________________________________________________________________________________________________________________ __________________________________________________________________________________________________________________________ WHEN TO CALL YOUR DOCTOR: Fever over 101.0 Inability to urinate Continued bleeding from incision. Increased pain, redness, or drainage from the incision. Increasing abdominal pain  The clinic staff is available to answer your questions during regular business hours.  Please don't hesitate to call and ask to speak to one of the nurses for clinical concerns.  If you have a medical emergency, go to the nearest emergency room or call 911.  A surgeon from [facility] is always on call at the hospital. [address], [address], [place], [place]  [address] ? [address] [address], [place], [place]   [address] [phone] ? [phone] ?   FAX [phone] Web site:  [email or web address] 

## 2022-09-13 NOTE — Anesthesia Postprocedure Evaluation (Signed)
Anesthesia Post Note  Patient: CHRISTIPHER RIEGER  Procedure(s) Performed: LAPAROSCOPIC CHOLECYSTECTOMY (Abdomen)     Patient location during evaluation: PACU Anesthesia Type: General Level of consciousness: awake and alert Pain management: pain level controlled Vital Signs Assessment: post-procedure vital signs reviewed and stable Respiratory status: spontaneous breathing, nonlabored ventilation, respiratory function stable and patient connected to nasal cannula oxygen Cardiovascular status: blood pressure returned to baseline and stable Postop Assessment: no apparent nausea or vomiting Anesthetic complications: no   No notable events documented.  Last Vitals:  Vitals:   09/13/22 0830 09/13/22 0835  BP: (!) 137/90   Pulse: 92 89  Resp: 19 20  Temp:  36.5 C  SpO2: 98% 97%    Last Pain:  Vitals:   09/13/22 0830  TempSrc:   PainSc: 0-No pain                 Collene Schlichter

## 2022-09-13 NOTE — Anesthesia Procedure Notes (Signed)
Procedure Name: Intubation Date/Time: 09/13/2022 7:21 AM  Performed by: Lonia Mad, CRNAPre-anesthesia Checklist: Patient identified, Emergency Drugs available, Suction available and Patient being monitored Patient Re-evaluated:Patient Re-evaluated prior to induction Oxygen Delivery Method: Circle System Utilized Preoxygenation: Pre-oxygenation with 100% oxygen Induction Type: IV induction Ventilation: Mask ventilation without difficulty Laryngoscope Size: Mac and 4 Grade View: Grade III Tube type: Oral Tube size: 7.5 mm Number of attempts: 1 Airway Equipment and Method: Stylet Placement Confirmation: ETT inserted through vocal cords under direct vision, positive ETCO2 and breath sounds checked- equal and bilateral Secured at: 22 cm Tube secured with: Tape Dental Injury: Teeth and Oropharynx as per pre-operative assessment

## 2022-09-13 NOTE — Op Note (Signed)
LAPAROSCOPIC CHOLECYSTECTOMY  Procedure Note  Daryl Foster 09/13/2022   Pre-op Diagnosis: CHRONIC CHOLECYSTITIS     Post-op Diagnosis: same  Procedure(s): LAPAROSCOPIC CHOLECYSTECTOMY  Surgeon(s): Abigail Miyamoto, MD  Anesthesia: General  Staff:  Circulator: Pietro Cassis, RN Scrub Person: Truddie Coco, RN; Carmela Rima  Estimated Blood Loss: Minimal               Specimens: sent to path  Indications: This is a 27 year old gentleman who presents with chronic abdominal pain and has had several bouts of pancreatitis.  His workup has been unremarkable so far but his symptoms seem consistent with biliary colic and possible tiny stones that are missing.  After long discussion with the family decision was made to proceed with a cholecystectomy  Findings: The patient found to have a chronically scarred appearing gallbladder consistent with chronic cholecystitis.  Procedure: The patient was brought to the operating identified as a correct patient.  He was placed upon on the operating table general anesthesia was induced.  His abdomen was then prepped and draped in usual sterile fashion.  I made a vertical incision below the umbilicus with a scalpel.  I carried this down to the fascia which was then opened with a scalpel as well.  A Kelly clamp was then used to pass to the peritoneal cavity under direct vision.  A 0 Vicryl pursing suture was placed around the fascial opening.  The [facility] port was placed at the opening and insufflation of the abdomen was begun.  A 5 mm trocar was then placed the patient's epigastrium and 2 more in the right upper quadrant all under direct vision.  The gallbladder was identified.  It was found to be thick-walled with several adhesions to it.  I bluntly dissected the adhesions free from the gallbladder.  I then dissected out the base of the gallbladder.  There was a small bridging vein on the cystic duct which I clipped proximally distally and  transected.  I then identified the cystic duct and achieved a critical window around it.  I clipped it 3 times proximally once distally and transected.  The cystic artery was identified clipped proximally distally and transected as well.  The gallbladder was then slowly dissected free from the liver bed with electrocautery.  Once it was freed from the liver bed it was placed in an Endosac and removed to the incision at the umbilicus.  Hemostasis was achieved at the gallbladder fossa with cautery.  We thoroughly irrigated the abdomen with normal saline.  The trocars were removed under direct vision and the 0 Vicryl was tied in place closing the fascial defect.  The abdomen was completely deflated.  All incisions were then anesthetized with Marcaine and closed with 4-0 Monocryl sutures.  Dermabond was then applied.  The patient tolerated the procedure well.  All the counts were correct at the end of the procedure.  The patient was then extubated in the operating room and taken in stable condition to the recovery room.          Abigail Miyamoto   Date: 09/13/2022  Time: 8:01 AM

## 2022-09-13 NOTE — Interval H&P Note (Signed)
History and Physical Interval Note:no change in H and P  09/13/2022 6:45 AM  Daryl Foster  has presented today for surgery, with the diagnosis of CHRONIC CHOLECYSTITIS.  The various methods of treatment have been discussed with the patient and family. After consideration of risks, benefits and other options for treatment, the patient has consented to  Procedure(s): LAPAROSCOPIC CHOLECYSTECTOMY (N/A) as a surgical intervention.  The patient's history has been reviewed, patient examined, no change in status, stable for surgery.  I have reviewed the patient's chart and labs.  Questions were answered to the patient's satisfaction.     Abigail Miyamoto

## 2022-09-13 NOTE — Transfer of Care (Signed)
Immediate Anesthesia Transfer of Care Note  Patient: Daryl Foster  Procedure(s) Performed: LAPAROSCOPIC CHOLECYSTECTOMY (Abdomen)  Patient Location: PACU  Anesthesia Type:General  Level of Consciousness: awake, alert , oriented, patient cooperative, and responds to stimulation  Airway & Oxygen Therapy: Patient Spontanous Breathing and Patient connected to nasal cannula oxygen  Post-op Assessment: Report given to RN and Post -op Vital signs reviewed and stable  Post vital signs: Reviewed and stable  Last Vitals:  Vitals Value Taken Time  BP 142/79 09/13/22 0815  Temp 37.1 C 09/13/22 0810  Pulse 91 09/13/22 0827  Resp 22 09/13/22 0827  SpO2 98 % 09/13/22 0827  Vitals shown include unvalidated device data.  Last Pain:  Vitals:   09/13/22 0810  TempSrc:   PainSc: 0-No pain         Complications: No notable events documented.

## 2022-09-14 ENCOUNTER — Encounter (HOSPITAL_COMMUNITY): Payer: Self-pay | Admitting: Surgery

## 2022-09-14 LAB — SURGICAL PATHOLOGY

## 2023-06-21 DIAGNOSIS — R7989 Other specified abnormal findings of blood chemistry: Secondary | ICD-10-CM | POA: Diagnosis not present

## 2023-06-24 DIAGNOSIS — Z0000 Encounter for general adult medical examination without abnormal findings: Secondary | ICD-10-CM | POA: Diagnosis not present

## 2023-06-24 DIAGNOSIS — R7301 Impaired fasting glucose: Secondary | ICD-10-CM | POA: Diagnosis not present

## 2023-06-24 DIAGNOSIS — R82998 Other abnormal findings in urine: Secondary | ICD-10-CM | POA: Diagnosis not present

## 2023-06-24 DIAGNOSIS — K863 Pseudocyst of pancreas: Secondary | ICD-10-CM | POA: Diagnosis not present

## 2023-06-24 DIAGNOSIS — Z1339 Encounter for screening examination for other mental health and behavioral disorders: Secondary | ICD-10-CM | POA: Diagnosis not present

## 2023-06-24 DIAGNOSIS — Z1331 Encounter for screening for depression: Secondary | ICD-10-CM | POA: Diagnosis not present

## 2023-06-24 DIAGNOSIS — Z23 Encounter for immunization: Secondary | ICD-10-CM | POA: Diagnosis not present
